# Patient Record
Sex: Female | Born: 1950
Health system: Southern US, Community
[De-identification: ages and names within clinical notes are randomized; demographics above are authoritative.]

## PROBLEM LIST (undated history)

## (undated) DIAGNOSIS — E079 Disorder of thyroid, unspecified: Secondary | ICD-10-CM

## (undated) DIAGNOSIS — K7689 Other specified diseases of liver: Secondary | ICD-10-CM

## (undated) DIAGNOSIS — E785 Hyperlipidemia, unspecified: Secondary | ICD-10-CM

## (undated) DIAGNOSIS — K5730 Diverticulosis of large intestine without perforation or abscess without bleeding: Secondary | ICD-10-CM

## (undated) DIAGNOSIS — M1990 Unspecified osteoarthritis, unspecified site: Secondary | ICD-10-CM

## (undated) HISTORY — DX: Hyperlipidemia, unspecified: E78.5

## (undated) HISTORY — DX: Diverticulosis of large intestine without perforation or abscess without bleeding: K57.30

## (undated) HISTORY — DX: Disorder of thyroid, unspecified: E07.9

## (undated) HISTORY — PX: TONSILLECTOMY: SUR1361

## (undated) HISTORY — DX: Other specified diseases of liver: K76.89

## (undated) HISTORY — PX: OTHER SURGICAL HISTORY: SHX169

---

## 2010-01-27 ENCOUNTER — Encounter: Payer: Self-pay | Admitting: Family Medicine

## 2010-02-22 LAB — CONVERTED CEMR LAB: Pap Smear: NORMAL

## 2010-02-23 ENCOUNTER — Encounter: Payer: Self-pay | Admitting: Family Medicine

## 2010-03-21 LAB — HM COLONOSCOPY: HM Colonoscopy: NORMAL

## 2010-03-22 LAB — HM PAP SMEAR

## 2010-04-19 ENCOUNTER — Encounter: Payer: Self-pay | Admitting: Family Medicine

## 2010-05-02 LAB — HM MAMMOGRAPHY: HM Mammogram: NORMAL

## 2010-06-22 ENCOUNTER — Ambulatory Visit: Payer: Self-pay | Admitting: Family Medicine

## 2010-06-22 DIAGNOSIS — I10 Essential (primary) hypertension: Secondary | ICD-10-CM | POA: Insufficient documentation

## 2010-06-22 DIAGNOSIS — E0590 Thyrotoxicosis, unspecified without thyrotoxic crisis or storm: Secondary | ICD-10-CM | POA: Insufficient documentation

## 2010-06-22 DIAGNOSIS — K5730 Diverticulosis of large intestine without perforation or abscess without bleeding: Secondary | ICD-10-CM | POA: Insufficient documentation

## 2010-06-22 DIAGNOSIS — E785 Hyperlipidemia, unspecified: Secondary | ICD-10-CM | POA: Insufficient documentation

## 2010-07-27 ENCOUNTER — Encounter: Payer: Self-pay | Admitting: Family Medicine

## 2010-07-27 ENCOUNTER — Ambulatory Visit: Payer: Self-pay | Admitting: Family Medicine

## 2010-07-31 ENCOUNTER — Encounter: Payer: Self-pay | Admitting: Family Medicine

## 2010-11-14 ENCOUNTER — Telehealth: Payer: Self-pay | Admitting: Family Medicine

## 2010-11-14 NOTE — Assessment & Plan Note (Signed)
Summary: NEW PT TO BE ESTABLISHED/JRR   Vital Signs:  Patient profile:   60 year old female Height:      60.25 inches Weight:      177 pounds BMI:     34.41 Temp:     98.3 degrees F oral Pulse rate:   64 / minute Pulse rhythm:   regular BP sitting:   124 / 80  (right arm) Cuff size:   regular  Vitals Entered By: Linde Gillis CMA Duncan Dull) (June 22, 2010 9:58 AM) CC: new patient, establish care   CC:  new patient and establish care.  History of Present Illness: 60 yo here to establish care.  HTN- has been on HCTZ 25 mg for years.  Questions if she can come off it because her BP has been great and she started this when she was going through a stressful time in her life.  Does not report dizziness when she stands from a seated position.  Does have a family h/o HTN.  HLD- just had labs drawn in [place], brings them in with her.  Has been on Lovastatin 40 mg for years.  Has h/o liver cyst but no elevated LFTs.  Hyperthyroidism- brings labs in with her today.  TSH was 0.01 in 02/2010.  Was not referred to endocrine because they were about to move to [facility].  Has noticed some loose stools, fatigue.  No palpiations, heat intolerance.  Well woman- UTD on prevention except for mammogram.  Preventive Screening-Counseling & Management  Alcohol-Tobacco     Smoking Status: never      Drug Use:  no.    Current Medications (verified): 1)  Lovastatin 40 Mg Tabs (Lovastatin) .... Take One Tablet By Mouth At Bedtime 2)  Hydrochlorothiazide 25 Mg Tabs (Hydrochlorothiazide) .... Take One Tablet By Mouth Every Morning 3)  Magnesium 500 Mg Tabs (Magnesium) .... Take One Tablet By Mouth At Bedtime 4)  Calcium-Vitamin D 500-200 Mg-Unit Tabs (Calcium Carbonate-Vitamin D) .... Take One Tablet By Mouth At Bedtime  Allergies (verified): 1)  ! Demerol 2)  ! Bactrim  Past History:  Family History: Last updated: 06/22/2010 Mom - HTN, HLD Dad- dementia  Social History: Last updated:  06/22/2010 Recently moved here from [place]. Son is an Administrator, arts hospitalist. Married Never Smoked Alcohol use-yes Drug use-no  Risk Factors: Smoking Status: never (06/22/2010)  Past Medical History: Diverticulosis, colon Hyperlipidemia Hypertension Hyperthyroidism h/o liver cyst  Past Surgical History: Tonsillectomy bone spurs removed from both big toes 2010  Family History: Mom - HTN, HLD Dad- dementia  Social History: Recently moved here from [place]. Son is an Art therapist. Married Never Smoked Alcohol use-yes Drug use-no Smoking Status:  never Drug Use:  no  Review of Systems      See HPI General:  Denies malaise. Eyes:  Denies blurring. ENT:  Denies difficulty swallowing. CV:  Denies chest pain or discomfort, near fainting, and palpitations. Resp:  Denies shortness of breath. GI:  Complains of diarrhea; denies abdominal pain, bloody stools, nausea, and vomiting. GU:  Denies abnormal vaginal bleeding. MS:  Denies joint pain, joint redness, and joint swelling. Derm:  Denies rash. Neuro:  Denies visual disturbances and weakness. Psych:  Denies anxiety and depression. Endo:  Denies cold intolerance and heat intolerance. Heme:  Denies abnormal bruising and bleeding.  Physical Exam  General:  alert, well-developed, and well-nourished.   Head:  normocephalic and atraumatic.   Eyes:  vision grossly intact, pupils equal, pupils round, and pupils reactive to light.  Ears:  R ear normal and L ear normal.   Nose:  no external deformity.   Mouth:  good dentition.   Neck:  No deformities, masses, or tenderness noted. Lungs:  Normal respiratory effort, chest expands symmetrically. Lungs are clear to auscultation, no crackles or wheezes. Heart:  Normal rate and regular rhythm. S1 and S2 normal without gallop, murmur, click, rub or other extra sounds. Abdomen:  Bowel sounds positive,abdomen soft and non-tender without masses, organomegaly or hernias noted. Msk:   No deformity or scoliosis noted of thoracic or lumbar spine.   Extremities:  No clubbing, cyanosis, edema, or deformity noted with normal full range of motion of all joints.   Neurologic:  No cranial nerve deficits noted. Station and gait are normal. Plantar reflexes are down-going bilaterally. DTRs are symmetrical throughout. Sensory, motor and coordinative functions appear intact. Skin:  Intact without suspicious lesions or rashes Psych:  Cognition and judgment appear intact. Alert and cooperative with normal attention span and concentration. No apparent delusions, illusions, hallucinations   Impression & Recommendations:  Problem # 1:  HYPERTENSION (ICD-401.9) Assessment Unchanged Agree wtih pt that she may not need HCTZ.  Will wean down to 12.5 mg daily and follow up in one month. Her updated medication list for this problem includes:    Hydrochlorothiazide 25 Mg Tabs (Hydrochlorothiazide) .Marland Kitchen... Take one tablet by mouth every morning  Problem # 2:  HYPERTHYROIDISM (ICD-242.90) Assessment: Deteriorated Refer to endocrinology for further evaluation and treatment. Orders: Endocrinology Referral (Endocrine)  Problem # 3:  HYPERLIPIDEMIA (ICD-272.4) Assessment: Unchanged Continue Lovastatin at current dose. Her updated medication list for this problem includes:    Lovastatin 40 Mg Tabs (Lovastatin) .Marland Kitchen... Take one tablet by mouth at bedtime  Problem # 4:  DIVERTICULOSIS, COLON (ICD-562.10) Assessment: Unchanged s/p normal colonoscopy.  Complete Medication List: 1)  Lovastatin 40 Mg Tabs (Lovastatin) .... Take one tablet by mouth at bedtime 2)  Hydrochlorothiazide 25 Mg Tabs (Hydrochlorothiazide) .... Take one tablet by mouth every morning 3)  Magnesium 500 Mg Tabs (Magnesium) .... Take one tablet by mouth at bedtime 4)  Calcium-vitamin D 500-200 Mg-unit Tabs (Calcium carbonate-vitamin d) .... Take one tablet by mouth at bedtime  Other Orders: Radiology Referral  (Radiology)  Patient Instructions: 1)  Great to meet you. 2)  Please stop by to see Shirlee Limerick on your way out. 3)  Try cutting your blood pressure medication in half and come see me in one month.    Current Allergies (reviewed today): ! DEMEROL ! BACTRIM  Prevention & Chronic Care Immunizations   Influenza vaccine: Not documented   Influenza vaccine due: Refused  (06/22/2010)    Tetanus booster: Not documented    Pneumococcal vaccine: Not documented  Colorectal Screening   Hemoccult: Not documented   Hemoccult due: Not Indicated    Colonoscopy: historical normal  (03/21/2010)   Colonoscopy due: 03/21/2020  Other Screening   Pap smear: normal  (03/22/2010)   Pap smear due: 02/23/2012    Mammogram: normal  (05/02/2010)   Mammogram action/deferral: Ordered  (06/22/2010)   Mammogram due: 05/02/2010   Smoking status: never  (06/22/2010)  Lipids   Total Cholesterol: Not documented   LDL: 92  (01/27/2010)   LDL Direct: Not documented   HDL: 47  (01/27/2010)   Triglycerides: Not documented    SGOT (AST): Not documented   SGPT (ALT): Not documented   Alkaline phosphatase: Not documented   Total bilirubin: Not documented  Hypertension   Last Blood Pressure: 124 / 80  (  06/22/2010)   Serum creatinine: Not documented   Serum potassium Not documented  Self-Management Support :    Hypertension self-management support: Not documented    Lipid self-management support: Not documented    Nursing Instructions: Schedule screening mammogram (see order)    Flu Vaccine Next Due:  Refused HDL Result Date:  01/27/2010 HDL Result:  47 HDL Next Due:  1 yr LDL Result Date:  01/27/2010 LDL Result:  92 LDL Next Due:  1 yr Flex Sig Next Due:  Not Indicated Colonoscopy Result Date:  03/21/2010 Colonoscopy Result:  historical normal Colonoscopy Next Due:  10 yr Hemoccult Next Due:  Not Indicated PAP Result Date:  02/22/2010 PAP Result:  normal PAP Next Due:  2  yr Mammogram Result Date:  05/02/2009 Mammogram Result:  normal Mammogram Next Due:  1 yr    Past Medical History:    Diverticulosis, colon    Hyperlipidemia    Hypertension    Hyperthyroidism    h/o liver cyst  Past Surgical History:    Tonsillectomy    bone spurs removed from both big toes 2010

## 2010-11-14 NOTE — Letter (Signed)
Summary: Terrilee Files MD  Terrilee Files MD   Imported By: Sherian Rein 06/30/2010 07:21:58  _____________________________________________________________________  External Attachment:    Type:   Image     Comment:   External Document

## 2010-11-14 NOTE — Consult Note (Signed)
Summary: [facility] Endocr[facility]  [facility]   Imported By: Lanelle Bal 08/14/2010 11:36:42  _____________________________________________________________________  External Attachment:    Type:   Image     Comment:   External Document

## 2010-11-22 ENCOUNTER — Ambulatory Visit (INDEPENDENT_AMBULATORY_CARE_PROVIDER_SITE_OTHER): Payer: BC Managed Care – PPO | Admitting: Family Medicine

## 2010-11-22 ENCOUNTER — Encounter: Payer: Self-pay | Admitting: Family Medicine

## 2010-11-22 DIAGNOSIS — I10 Essential (primary) hypertension: Secondary | ICD-10-CM

## 2010-11-22 NOTE — Progress Notes (Signed)
Summary: lovastatin, hctz  Phone Note Refill Request Message from:  Fax from Pharmacy  Refills Requested: Medication #1:  LOVASTATIN 40 MG TABS take one tablet by mouth at bedtime   Last Refilled: 08/16/2010  Medication #2:  HYDROCHLOROTHIAZIDE 25 MG TABS take one tablet by mouth every morning   Last Refilled: 08/16/2010 [facility] [address]  Pt  had new patient appt in september, was told to follow up in october, no appts scheduled.  Initial call taken by: Lowella Petties CMA, AAMA,  November 14, 2010 9:26 AM  Follow-up for Phone Call        ok to fill with no refills, needs appt. Ruthe Mannan MD  November 14, 2010 9:28 AM     Prescriptions: HYDROCHLOROTHIAZIDE 25 MG TABS (HYDROCHLOROTHIAZIDE) take one tablet by mouth every morning  #30 x 0   Entered by:   Lowella Petties CMA, AAMA   Authorized by:   Ruthe Mannan MD   Signed by:   Lowella Petties CMA, AAMA on 11/14/2010   Method used:   [facility]  [address] (retail[facility]       [address]       [place], [place]  [address]       Ph: [phone] or [phone]       Fax: [phone]   RxID:   [id number] LOVASTATIN 40 MG TABS (LOVASTATIN) take one tablet by mouth at bedtime  #30 x 0   Entered by:   Lowella Petties CMA, AAMA   Authorized by:   Ruthe Mannan MD   Signed by:   Lowella Petties CMA, AAMA on 11/14/2010   Method used:   [facility]        [facility]  [address] (retail)       [address]       [place], [place]  [address]       Ph: [phone] or [phone]       Fax: [phone]   RxID:   [id number]

## 2010-11-30 NOTE — Assessment & Plan Note (Signed)
Summary: CHECK BP/CLE   [employer]   Vital Signs:  Patient profile:   60 year old female Height:      60.25 inches Weight:      179.50 pounds BMI:     34.89 Temp:     98.2 degrees F oral Pulse rate:   82 / minute Pulse rhythm:   regular BP sitting:   120 / 80  (left arm) Cuff size:   large  Vitals Entered By: Linde Gillis CMA Duncan Dull) (November 22, 2010 7:54 AM) CC: blood pressure check   History of Present Illness: 61 yo here to follow up HTN.   HTN- had been on HCTZ 25 mg for years. At last office visit in September, we decreased her HCTZ to 12. 5 mg daily since her BP was so good.  She has been tolerating that very well.  In fact, her BP is 120/80 today.  She would prefer to continue on this dose for now since she has been travelling back and forth to [place] to see her sick father.     Current Medications (verified): 1)  Lovastatin 40 Mg Tabs (Lovastatin) .... Take One Tablet By Mouth At Bedtime 2)  Hydrochlorothiazide 25 Mg Tabs (Hydrochlorothiazide) .... Take One Tablet By Mouth Every Morning 3)  Magnesium 500 Mg Tabs (Magnesium) .... Take One Tablet By Mouth At Bedtime 4)  Calcium-Vitamin D 500-200 Mg-Unit Tabs (Calcium Carbonate-Vitamin D) .... Take One Tablet By Mouth At Bedtime  Allergies: 1)  ! Demerol 2)  ! Bactrim  Past History:  Past Medical History: Last updated: 06/22/2010 Diverticulosis, colon Hyperlipidemia Hypertension Hyperthyroidism h/o liver cyst  Past Surgical History: Last updated: 06/22/2010 Tonsillectomy bone spurs removed from both big toes 2010  Family History: Last updated: 06/22/2010 Mom - HTN, HLD Dad- dementia  Social History: Last updated: 06/22/2010 Recently moved here from [place]. Son is an Administrator, arts hospitalist. Married Never Smoked Alcohol use-yes Drug use-no  Risk Factors: Smoking Status: never (06/22/2010)  Review of Systems      See HPI General:  Denies malaise. CV:  Denies chest pain or discomfort. Resp:  Denies  shortness of breath.  Physical Exam  General:  alert, well-developed, and well-nourished.   normotensive Mouth:  good dentition.   Lungs:  Normal respiratory effort, chest expands symmetrically. Lungs are clear to auscultation, no crackles or wheezes. Heart:  Normal rate and regular rhythm. S1 and S2 normal without gallop, murmur, click, rub or other extra sounds. Extremities:  No clubbing, cyanosis, edema, or deformity noted with normal full range of motion of all joints.   Neurologic:  No cranial nerve deficits noted. Station and gait are normal. Plantar reflexes are down-going bilaterally. DTRs are symmetrical throughout. Sensory, motor and coordinative functions appear intact. Psych:  Cognition and judgment appear intact. Alert and cooperative with normal attention span and concentration. No apparent delusions, illusions, hallucinations   Impression & Recommendations:  Problem # 1:  HYPERTENSION (ICD-401.9) Assessment Unchanged Reamins stable on 12. 5 mg daily.  Continue current dose.  Follow up in September (yearly CPX). Her updated medication list for this problem includes:    Hydrochlorothiazide 12.5 Mg Tabs (Hydrochlorothiazide) .Marland Kitchen... Take 1 tab  by mouth every morning  Complete Medication List: 1)  Lovastatin 40 Mg Tabs (Lovastatin) .... Take one tablet by mouth at bedtime 2)  Hydrochlorothiazide 12.5 Mg Tabs (Hydrochlorothiazide) .... Take 1 tab  by mouth every morning 3)  Magnesium 500 Mg Tabs (Magnesium) .... Take one tablet by mouth at bedtime 4)  Calcium-vitamin D 500-200 Mg-unit Tabs (Calcium carbonate-vitamin d) .... Take one tablet by mouth at bedtime Prescriptions: HYDROCHLOROTHIAZIDE 12.5 MG  TABS (HYDROCHLOROTHIAZIDE) Take 1 tab  by mouth every morning  #90 x 3   Entered and Authorized by:   Ruthe Mannan MD   Signed by:   Ruthe Mannan MD on 11/22/2010   Method used:   Print then Give to Patient   RxID:   [id number] LOVASTATIN 40 MG TABS (LOVASTATIN) take one  tablet by mouth at bedtime  #30 x 6   Entered and Authorized by:   Ruthe Mannan MD   Signed by:   Ruthe Mannan MD on 11/22/2010   Method used:   Print then Give to Patient   RxID:   [id number]    Orders Added: 1)  Est. Patient Level III [[address]]    Current Allergies (reviewed today): ! DEMEROL ! BACTRIM

## 2011-06-15 ENCOUNTER — Other Ambulatory Visit: Payer: Self-pay | Admitting: Family Medicine

## 2011-06-15 DIAGNOSIS — E785 Hyperlipidemia, unspecified: Secondary | ICD-10-CM

## 2011-06-15 DIAGNOSIS — E0590 Thyrotoxicosis, unspecified without thyrotoxic crisis or storm: Secondary | ICD-10-CM

## 2011-06-15 DIAGNOSIS — I10 Essential (primary) hypertension: Secondary | ICD-10-CM

## 2011-06-21 ENCOUNTER — Other Ambulatory Visit (INDEPENDENT_AMBULATORY_CARE_PROVIDER_SITE_OTHER): Payer: BC Managed Care – PPO

## 2011-06-21 DIAGNOSIS — E785 Hyperlipidemia, unspecified: Secondary | ICD-10-CM

## 2011-06-21 DIAGNOSIS — E0590 Thyrotoxicosis, unspecified without thyrotoxic crisis or storm: Secondary | ICD-10-CM

## 2011-06-21 DIAGNOSIS — I10 Essential (primary) hypertension: Secondary | ICD-10-CM

## 2011-06-21 LAB — CBC WITH DIFFERENTIAL/PLATELET
Basophils Absolute: 0 10*3/uL (ref 0.0–0.1)
Basophils Relative: 0.6 % (ref 0.0–3.0)
Eosinophils Relative: 1.9 % (ref 0.0–5.0)
HCT: 44.3 % (ref 36.0–46.0)
Hemoglobin: 15.1 g/dL — ABNORMAL HIGH (ref 12.0–15.0)
Lymphocytes Relative: 28.8 % (ref 12.0–46.0)
Lymphs Abs: 1.6 10*3/uL (ref 0.7–4.0)
Monocytes Relative: 6.5 % (ref 3.0–12.0)
Neutro Abs: 3.4 10*3/uL (ref 1.4–7.7)
RBC: 4.82 Mil/uL (ref 3.87–5.11)
RDW: 12.4 % (ref 11.5–14.6)

## 2011-06-21 LAB — LIPID PANEL
Cholesterol: 195 mg/dL (ref 0–200)
HDL: 44.4 mg/dL (ref >39.00)
Total CHOL/HDL Ratio: 4
Triglycerides: 235 mg/dL — ABNORMAL HIGH (ref 0.0–149.0)

## 2011-06-21 LAB — COMPREHENSIVE METABOLIC PANEL
Alkaline Phosphatase: 86 U/L (ref 39–117)
Creatinine, Ser: 0.8 mg/dL (ref 0.4–1.2)
GFR: 83.95 mL/min (ref >60.00)
Glucose, Bld: 95 mg/dL (ref 70–99)
Sodium: 140 mEq/L (ref 135–145)
Total Bilirubin: 1 mg/dL (ref 0.3–1.2)
Total Protein: 6.6 g/dL (ref 6.0–8.3)

## 2011-06-21 LAB — TSH: TSH: 0.03 u[IU]/mL — ABNORMAL LOW (ref 0.35–5.50)

## 2011-06-26 ENCOUNTER — Other Ambulatory Visit (HOSPITAL_COMMUNITY)
Admission: RE | Admit: 2011-06-26 | Discharge: 2011-06-26 | Disposition: A | Discharge status: Home / Self Care | Payer: BC Managed Care – PPO | Source: Ambulatory Visit | Attending: Family Medicine | Admitting: Family Medicine

## 2011-06-26 ENCOUNTER — Ambulatory Visit (INDEPENDENT_AMBULATORY_CARE_PROVIDER_SITE_OTHER): Payer: BC Managed Care – PPO | Admitting: Family Medicine

## 2011-06-26 ENCOUNTER — Encounter: Payer: Self-pay | Admitting: Family Medicine

## 2011-06-26 VITALS — BP 120/82 | HR 83 | Temp 98.1°F | Ht 60.0 in | Wt 175.5 lb

## 2011-06-26 DIAGNOSIS — E785 Hyperlipidemia, unspecified: Secondary | ICD-10-CM

## 2011-06-26 DIAGNOSIS — Z0000 Encounter for general adult medical examination without abnormal findings: Secondary | ICD-10-CM | POA: Insufficient documentation

## 2011-06-26 DIAGNOSIS — E0590 Thyrotoxicosis, unspecified without thyrotoxic crisis or storm: Secondary | ICD-10-CM

## 2011-06-26 DIAGNOSIS — Z01419 Encounter for gynecological examination (general) (routine) without abnormal findings: Secondary | ICD-10-CM | POA: Insufficient documentation

## 2011-06-26 DIAGNOSIS — I10 Essential (primary) hypertension: Secondary | ICD-10-CM

## 2011-06-26 DIAGNOSIS — Z1159 Encounter for screening for other viral diseases: Secondary | ICD-10-CM | POA: Insufficient documentation

## 2011-06-26 DIAGNOSIS — Z1231 Encounter for screening mammogram for malignant neoplasm of breast: Secondary | ICD-10-CM

## 2011-06-26 MED ORDER — FLUTICASONE PROPIONATE 50 MCG/ACT NA SUSP: 1.0000 | Freq: Every day | NASAL | Status: DC

## 2011-06-26 NOTE — Progress Notes (Signed)
Subjective:    Patient ID: Julia Moreno, female    DOB: 04/05/1951, 60 y.o.   MRN: [id number]  HPI  60 yo here for CPX.  HTN- was previously taking HCTZ. Well controlled now without medication, trying to walk more. No CP, SOB, blurred vision.  HLD-  Lab Results  Component Value Date   CHOL 195 06/21/2011   Lab Results  Component Value Date   HDL 44.40 06/21/2011   HDL 47 01/27/2010   Lab Results  Component Value Date   LDLCALC 92 01/27/2010   Lab Results  Component Value Date   TRIG 235.0* 06/21/2011   Lab Results  Component Value Date   ALT 37* 06/21/2011   AST 22 06/21/2011   ALKPHOS 86 06/21/2011   BILITOT 1.0 06/21/2011    Has been on Lovastatin 40 mg for years. Has h/o liver cyst but no elevated LFTs. Admits to eating more sweets lately.  Hyperthyroidism- TSH low but FT4 within normal limits.  Has endocrinologist. Biopsy neg, told it was thyroiditis. Lab Results  Component Value Date   TSH 0.03* 06/21/2011   Patient Active Problem List  Diagnoses  . HYPERTHYROIDISM  . HYPERLIPIDEMIA  . HYPERTENSION  . DIVERTICULOSIS, COLON  . Routine general medical examination at a health care facility    History  Substance Use Topics  . Smoking status: Not on file  . Smokeless tobacco: Not on file  . Alcohol Use: Not on file   No family history on file. Allergies  Allergen Reactions  . Meperidine Hcl     REACTION: vomiting  . Sulfamethoxazole W/Trimethoprim     REACTION: hives   No current outpatient prescriptions on file prior to visit.   The PMH, PSH, Social History, Family History, Medications, and allergies have been reviewed in [facility], and have been updated if relevant.   Review of Systems See HPI Patient reports no  vision/ hearing changes,anorexia, weight change, fever ,adenopathy, persistant / recurrent hoarseness, swallowing issues, chest pain, edema,persistant / recurrent cough, hemoptysis, dyspnea(rest, exertional, paroxysmal nocturnal), gastrointestinal   bleeding (melena, rectal bleeding), abdominal pain, excessive heart burn, GU symptoms(dysuria, hematuria, pyuria, voiding/incontinence  Issues) syncope, focal weakness, severe memory loss, concerning skin lesions, depression, anxiety, abnormal bruising/bleeding, major joint swelling, breast masses or abnormal vaginal bleeding.       Objective:   Physical Exam BP 120/82  Pulse 83  Temp(Src) 98.1 F (36.7 C) (Oral)  Ht 5' (1.524 m)  Wt 175 lb 8 oz (79.606 kg)  BMI 34.27 kg/m2  General:  Well-developed,well-nourished,in no acute distress; alert,appropriate and cooperative throughout examination Head:  normocephalic and atraumatic.   Eyes:  vision grossly intact, pupils equal, pupils round, and pupils reactive to light.   Ears:  R ear normal and L ear normal.   Nose:  no external deformity.   Mouth:  good dentition.   Neck:  No deformities, masses, or tenderness noted. Breasts:  No mass, nodules, thickening, tenderness, bulging, retraction, inflamation, nipple discharge or skin changes noted.   Lungs:  Normal respiratory effort, chest expands symmetrically. Lungs are clear to auscultation, no crackles or wheezes. Heart:  Normal rate and regular rhythm. S1 and S2 normal without gallop, murmur, click, rub or other extra sounds. Abdomen:  Bowel sounds positive,abdomen soft and non-tender without masses, organomegaly or hernias noted. Rectal:  no external abnormalities.   Genitalia:  Pelvic Exam:        External: normal female genitalia without lesions or masses        Vagina:  normal without lesions or masses        Cervix: normal without lesions or masses        Adnexa: normal bimanual exam without masses or fullness        Uterus: normal by palpation        Pap smear: performed Msk:  No deformity or scoliosis noted of thoracic or lumbar spine.   Extremities:  No clubbing, cyanosis, edema, or deformity noted with normal full range of motion of all joints.   Neurologic:  alert & oriented X3  and gait normal.   Skin:  Intact without suspicious lesions or rashes Cervical Nodes:  No lymphadenopathy noted Axillary Nodes:  No palpable lymphadenopathy Psych:  Cognition and judgment appear intact. Alert and cooperative with normal attention span and concentration. No apparent delusions, illusions, hallucinations    Assessment & Plan:   1. HYPERTENSION  Stable without meds.   2. HYPERTHYROIDISM  FT4 within normal limits, results forwarded to her endocrinologist.    3. HYPERLIPIDEMIA  Deteriorated. Discussed decreasing added sugars, eliminate trans fats, increase fiber and limit alcohol.   4. Routine general medical examination at a health care facility  Reviewed preventive care protocols, scheduled due services, and updated immunizations Discussed nutrition, exercise, diet, and healthy lifestyle.  Cytology -Pap Smear Mammogram IFOB

## 2011-06-26 NOTE — Patient Instructions (Signed)
Great to see you. Please stop by to see Julia Moreno on your way out.   

## 2011-06-28 ENCOUNTER — Encounter: Payer: Self-pay | Admitting: *Deleted

## 2011-07-05 ENCOUNTER — Other Ambulatory Visit: Payer: Self-pay | Admitting: Family Medicine

## 2011-07-05 ENCOUNTER — Encounter: Payer: Self-pay | Admitting: *Deleted

## 2011-07-05 ENCOUNTER — Other Ambulatory Visit: Payer: BC Managed Care – PPO

## 2011-07-05 DIAGNOSIS — Z1211 Encounter for screening for malignant neoplasm of colon: Secondary | ICD-10-CM

## 2011-07-05 LAB — FECAL OCCULT BLOOD, IMMUNOCHEMICAL: Fecal Occult Bld: NEGATIVE

## 2011-08-22 ENCOUNTER — Ambulatory Visit: Payer: Self-pay | Admitting: Family Medicine

## 2011-08-23 ENCOUNTER — Encounter: Payer: Self-pay | Admitting: *Deleted

## 2011-08-23 ENCOUNTER — Encounter: Payer: Self-pay | Admitting: Family Medicine

## 2012-01-10 ENCOUNTER — Other Ambulatory Visit: Payer: Self-pay | Admitting: Family Medicine

## 2012-04-23 ENCOUNTER — Other Ambulatory Visit: Payer: Self-pay | Admitting: Family Medicine

## 2012-05-13 ENCOUNTER — Ambulatory Visit (INDEPENDENT_AMBULATORY_CARE_PROVIDER_SITE_OTHER): Payer: BC Managed Care – PPO | Admitting: Family Medicine

## 2012-05-13 ENCOUNTER — Encounter: Payer: Self-pay | Admitting: Family Medicine

## 2012-05-13 VITALS — BP 100/64 | HR 68 | Temp 98.2°F | Wt 178.0 lb

## 2012-05-13 DIAGNOSIS — M7700 Medial epicondylitis, unspecified elbow: Secondary | ICD-10-CM

## 2012-05-13 NOTE — Patient Instructions (Addendum)
Great to see you. Please follow exercises as directed. You have rotator cuff impingement Aleve and/or tylenol as needed for pain. Cortisone injection may be helpful- you can make an appointment with Dr. Patsy Lager in the future as we discussed.

## 2012-05-13 NOTE — Progress Notes (Signed)
SUBJECTIVE: Julia Moreno is a 61 y.o. female right elbow pain x 2 months.  No known injury but she has been playing golf almost daily for past two months.  More swollen in afternoon.  No radiculopathy or UE weakness.   Prior history of related problems: no prior problems with this area in the past.  Patient Active Problem List  Diagnosis  . HYPERTHYROIDISM  . HYPERLIPIDEMIA  . HYPERTENSION  . DIVERTICULOSIS, COLON  . Routine general medical examination at a health care facility  . Epicondylitis elbow, medial   Past Medical History  Diagnosis Date  . Diverticulosis of colon   . Hypertension   . Hyperlipidemia   . Thyroid disease   . Liver cyst    Past Surgical History  Procedure Date  . Tonsillectomy   . Bone spur     REMOVED FROM BOTHE BIG TOES 2010   History  Substance Use Topics  . Smoking status: Never Smoker   . Smokeless tobacco: Not on file  . Alcohol Use: Yes   Family History  Problem Relation Age of Onset  . Hyperlipidemia Mother   . Hypertension Mother   . Dementia Father    Allergies  Allergen Reactions  . Meperidine Hcl     REACTION: vomiting  . Sulfamethoxazole W-Trimethoprim     REACTION: hives   Current Outpatient Prescriptions on File Prior to Visit  Medication Sig Dispense Refill  . Calcium Carb-Cholecalciferol (CALCIUM 500 +D) 500-400 MG-UNIT TABS Take 1 capsule by mouth at bedtime.        . fluticasone (FLONASE) 50 MCG/ACT nasal spray PLACE 1 SPRAY INTO THE NOSE DAILY.  16 g  3  . lovastatin (MEVACOR) 40 MG tablet TAKE ONE TABLET BY MOUTH AT BEDTIME  30 tablet  6   The PMH, PSH, Social History, Family History, Medications, and allergies have been reviewed in [facility], and have been updated if relevant.  OBJECTIVE: BP 100/64  Pulse 68  Temp 98.2 F (36.8 C) (Oral)  Wt 178 lb (80.74 kg)  Appearance: alert, well appearing, and in no distress. Elbow exam: medial epicondylar tenderness. X-ray: not indicated.  ASSESSMENT: elbow  epicondylitis  PLAN: Given handout from sports med advisor with supportive care. If no improvement, discussed cortisone injections (she could make appt with Dr. Patsy Moreno). The patient indicates understanding of these issues and agrees with the plan.

## 2012-05-13 NOTE — Progress Notes (Signed)
  Subjective:    Patient ID: Julia Moreno, female    DOB: [birth year], 61 y.o.   MRN: [id number]  HPI  61 yo here for right elbow pain x 2 months.  Review of Systems     Objective:   Physical Exam        Assessment & Plan:

## 2012-06-23 ENCOUNTER — Other Ambulatory Visit: Payer: Self-pay | Admitting: Family Medicine

## 2012-06-23 DIAGNOSIS — E0590 Thyrotoxicosis, unspecified without thyrotoxic crisis or storm: Secondary | ICD-10-CM

## 2012-06-23 DIAGNOSIS — I10 Essential (primary) hypertension: Secondary | ICD-10-CM

## 2012-06-23 DIAGNOSIS — E785 Hyperlipidemia, unspecified: Secondary | ICD-10-CM

## 2012-06-23 DIAGNOSIS — Z0000 Encounter for general adult medical examination without abnormal findings: Secondary | ICD-10-CM

## 2012-06-26 ENCOUNTER — Other Ambulatory Visit (INDEPENDENT_AMBULATORY_CARE_PROVIDER_SITE_OTHER): Payer: BC Managed Care – PPO

## 2012-06-26 DIAGNOSIS — E785 Hyperlipidemia, unspecified: Secondary | ICD-10-CM

## 2012-06-26 DIAGNOSIS — E0590 Thyrotoxicosis, unspecified without thyrotoxic crisis or storm: Secondary | ICD-10-CM

## 2012-06-26 DIAGNOSIS — Z0000 Encounter for general adult medical examination without abnormal findings: Secondary | ICD-10-CM

## 2012-06-26 LAB — CBC WITH DIFFERENTIAL/PLATELET
Basophils Absolute: 0 10*3/uL (ref 0.0–0.1)
Basophils Relative: 0.5 % (ref 0.0–3.0)
Eosinophils Absolute: 0.1 10*3/uL (ref 0.0–0.7)
Hemoglobin: 14.6 g/dL (ref 12.0–15.0)
Lymphocytes Relative: 13.1 % (ref 12.0–46.0)
Monocytes Relative: 7 % (ref 3.0–12.0)
Neutro Abs: 4.9 10*3/uL (ref 1.4–7.7)
Neutrophils Relative %: 77.8 % — ABNORMAL HIGH (ref 43.0–77.0)
RBC: 4.68 Mil/uL (ref 3.87–5.11)
RDW: 12.3 % (ref 11.5–14.6)

## 2012-06-26 LAB — LIPID PANEL
HDL: 41 mg/dL (ref >39.00)
Triglycerides: 167 mg/dL — ABNORMAL HIGH (ref 0.0–149.0)
VLDL: 33.4 mg/dL (ref 0.0–40.0)

## 2012-06-26 LAB — COMPREHENSIVE METABOLIC PANEL
Albumin: 4.3 g/dL (ref 3.5–5.2)
BUN: 21 mg/dL (ref 6–23)
CO2: 22 mEq/L (ref 19–32)
Calcium: 9.5 mg/dL (ref 8.4–10.5)
Chloride: 104 mEq/L (ref 96–112)
Creatinine, Ser: 1 mg/dL (ref 0.4–1.2)
GFR: 60.03 mL/min (ref >60.00)
Glucose, Bld: 99 mg/dL (ref 70–99)
Potassium: 4.4 mEq/L (ref 3.5–5.1)

## 2012-06-30 ENCOUNTER — Encounter: Payer: Self-pay | Admitting: Family Medicine

## 2012-06-30 ENCOUNTER — Ambulatory Visit (INDEPENDENT_AMBULATORY_CARE_PROVIDER_SITE_OTHER): Payer: BC Managed Care – PPO | Admitting: Family Medicine

## 2012-06-30 VITALS — BP 120/78 | HR 68 | Temp 98.1°F | Ht 60.25 in | Wt 175.0 lb

## 2012-06-30 DIAGNOSIS — Z1231 Encounter for screening mammogram for malignant neoplasm of breast: Secondary | ICD-10-CM

## 2012-06-30 DIAGNOSIS — I10 Essential (primary) hypertension: Secondary | ICD-10-CM

## 2012-06-30 DIAGNOSIS — E785 Hyperlipidemia, unspecified: Secondary | ICD-10-CM

## 2012-06-30 DIAGNOSIS — Z1211 Encounter for screening for malignant neoplasm of colon: Secondary | ICD-10-CM

## 2012-06-30 DIAGNOSIS — Z0000 Encounter for general adult medical examination without abnormal findings: Secondary | ICD-10-CM

## 2012-06-30 DIAGNOSIS — K5730 Diverticulosis of large intestine without perforation or abscess without bleeding: Secondary | ICD-10-CM

## 2012-06-30 DIAGNOSIS — D369 Benign neoplasm, unspecified site: Secondary | ICD-10-CM

## 2012-06-30 DIAGNOSIS — E0590 Thyrotoxicosis, unspecified without thyrotoxic crisis or storm: Secondary | ICD-10-CM

## 2012-06-30 NOTE — Addendum Note (Signed)
Addended by: Alvina Chou on: 06/30/2012 10:22 AM   Modules accepted: Orders

## 2012-06-30 NOTE — Patient Instructions (Signed)
Great to see you!  Check with your insurance to see if they will cover the shingles shot.  Please stop by to see Shirlee Limerick on your way out to set up your way out.

## 2012-06-30 NOTE — Progress Notes (Signed)
Subjective:    Patient ID: Julia Moreno, female    DOB: 04/28/1951, 61 y.o.   MRN: [id number]  HPI  61 yo female with h/o HTN, HLD here for CPX.  Normal pap smear in 06/2011.  Due for mammogram, zostavax, flu shot and IFOB.  Has been working on her diet, increasing her exercise.  Wt Readings from Last 3 Encounters:  06/30/12 175 lb (79.379 kg)  05/13/12 178 lb (80.74 kg)  06/26/11 175 lb 8 oz (79.606 kg)    HTN- was previously taking HCTZ. Well controlled now without medication, trying to walk more. No CP, SOB, blurred vision.  HLD-   Lab Results  Component Value Date   CHOL 185 06/26/2012   HDL 41.00 06/26/2012   LDLCALC 111* 06/26/2012   LDLDIRECT 122.9 06/21/2011   TRIG 167.0* 06/26/2012   CHOLHDL 5 06/26/2012     Has been on Lovastatin 40 mg for years. Has h/o liver cyst but no elevated LFTs. Admits to eating more sweets lately.  Hyperthyroidism-   Has endocrinologist, Dr. Tedd Sias. Biopsy neg, told it was thyroiditis. Lab Results  Component Value Date   TSH 1.86 06/26/2012   Patient Active Problem List  Diagnosis  . HYPERTHYROIDISM  . HYPERLIPIDEMIA  . HYPERTENSION  . DIVERTICULOSIS, COLON  . Routine general medical examination at a health care facility  . Epicondylitis elbow, medial    History  Substance Use Topics  . Smoking status: Never Smoker   . Smokeless tobacco: Not on file  . Alcohol Use: Yes   Family History  Problem Relation Age of Onset  . Hyperlipidemia Mother   . Hypertension Mother   . Dementia Father    Allergies  Allergen Reactions  . Meperidine Hcl     REACTION: vomiting  . Sulfamethoxazole W-Trimethoprim     REACTION: hives   Current Outpatient Prescriptions on File Prior to Visit  Medication Sig Dispense Refill  . Calcium Carb-Cholecalciferol (CALCIUM 500 +D) 500-400 MG-UNIT TABS Take 1 capsule by mouth at bedtime.        . fluticasone (FLONASE) 50 MCG/ACT nasal spray PLACE 1 SPRAY INTO THE NOSE DAILY.  16 g  3  .  lovastatin (MEVACOR) 40 MG tablet TAKE ONE TABLET BY MOUTH AT BEDTIME  30 tablet  6  . Probiotic Product (PROBIOTIC PO) Take by mouth.       The PMH, PSH, Social History, Family History, Medications, and allergies have been reviewed in [facility], and have been updated if relevant.   Review of Systems See HPI Patient reports no  vision/ hearing changes,anorexia, weight change, fever ,adenopathy, persistant / recurrent hoarseness, swallowing issues, chest pain, edema,persistant / recurrent cough, hemoptysis, dyspnea(rest, exertional, paroxysmal nocturnal), gastrointestinal  bleeding (melena, rectal bleeding), abdominal pain, excessive heart burn, GU symptoms(dysuria, hematuria, pyuria, voiding/incontinence  Issues) syncope, focal weakness, severe memory loss, concerning skin lesions, depression, anxiety, abnormal bruising/bleeding, major joint swelling, breast masses or abnormal vaginal bleeding.       Objective:   Physical Exam BP 120/78  Pulse 68  Temp 98.1 F (36.7 C)  Ht 5' 0.25" (1.53 m)  Wt 175 lb (79.379 kg)  BMI 33.89 kg/m2  General:  Well-developed,well-nourished,in no acute distress; alert,appropriate and cooperative throughout examination Head:  normocephalic and atraumatic.   Eyes:  vision grossly intact, pupils equal, pupils round, and pupils reactive to light.   Ears:  R ear normal and L ear normal.   Nose:  no external deformity.   Mouth:  good dentition.   Neck:  No deformities, masses, or tenderness noted. Breasts:  No mass, nodules, thickening, tenderness, bulging, retraction, inflamation, nipple discharge or skin changes noted.   Lungs:  Normal respiratory effort, chest expands symmetrically. Lungs are clear to auscultation, no crackles or wheezes. Heart:  Normal rate and regular rhythm. S1 and S2 normal without gallop, murmur, click, rub or other extra sounds. Abdomen:  Bowel sounds positive,abdomen soft and non-tender without masses, organomegaly or hernias noted. Msk:   No deformity or scoliosis noted of thoracic or lumbar spine.   Extremities:  No clubbing, cyanosis, edema, or deformity noted with normal full range of motion of all joints.   Neurologic:  alert & oriented X3 and gait normal.   Skin:  Firm, freely movable mass on left back shoulder, non tender to palpation, no erythema or drainage. Cervical Nodes:  No lymphadenopathy noted Axillary Nodes:  No palpable lymphadenopathy Psych:  Cognition and judgment appear intact. Alert and cooperative with normal attention span and concentration. No apparent delusions, illusions, hallucinations    Assessment & Plan:   1. HYPERTENSION  Stable without meds.   2. HYPERTHYROIDISM  Stable, followed by Dr. Tedd Sias.    3. HYPERLIPIDEMIA  Lipid panel excellent. Continue current meds.   4. Routine general medical examination at a health care facility  Reviewed preventive care protocols, scheduled due services, and updated immunizations Discussed nutrition, exercise, diet, and healthy lifestyle.   Mammogram I   5. Cyst, likely epidermoid  Refer to derm.

## 2012-07-07 ENCOUNTER — Encounter: Payer: Self-pay | Admitting: Family Medicine

## 2012-07-07 ENCOUNTER — Ambulatory Visit (INDEPENDENT_AMBULATORY_CARE_PROVIDER_SITE_OTHER): Payer: BC Managed Care – PPO | Admitting: Family Medicine

## 2012-07-07 VITALS — BP 114/70 | HR 72 | Temp 97.8°F | Wt 174.0 lb

## 2012-07-07 DIAGNOSIS — M7711 Lateral epicondylitis, right elbow: Secondary | ICD-10-CM

## 2012-07-07 DIAGNOSIS — M7710 Lateral epicondylitis, unspecified elbow: Secondary | ICD-10-CM

## 2012-07-07 NOTE — Progress Notes (Signed)
Patient presents with lateral elbow pain.  Length of symptoms:  5 months Hand effected: R  Patient describes a dull ache on the lateral elbow. There is some translation in the proximal forearm and in the distal upper arm. It is painful to lift with the hand facing down and to lift with the thumb in an upright position. Supination is painful. Patient points to the lateral epicondyle as the point of maximal tenderness near ECRB.  Golfer No trauma.   No prior fractures or operative interventions in the effective hand. Prior PT or HEP: few days of HEP  Denies numbness or tingling. No significant neck or shoulder pain.  Hand of dominance: R  Patient Active Problem List  Diagnosis  . HYPERTHYROIDISM  . HYPERLIPIDEMIA  . HYPERTENSION  . DIVERTICULOSIS, COLON  . Routine general medical examination at a health care facility  . Epicondylitis elbow, medial    Past Medical History  Diagnosis Date  . Diverticulosis of colon   . Hypertension   . Hyperlipidemia   . Thyroid disease   . Liver cyst     Past Surgical History  Procedure Date  . Tonsillectomy   . Bone spur     REMOVED FROM BOTHE BIG TOES 2010    History  Substance Use Topics  . Smoking status: Never Smoker   . Smokeless tobacco: Not on file  . Alcohol Use: Yes    Family History  Problem Relation Age of Onset  . Hyperlipidemia Mother   . Hypertension Mother   . Dementia Father     Allergies  Allergen Reactions  . Meperidine Hcl     REACTION: vomiting  . Sulfamethoxazole W-Trimethoprim     REACTION: hives    Current Outpatient Prescriptions on File Prior to Visit  Medication Sig Dispense Refill  . Calcium Carb-Cholecalciferol (CALCIUM 500 +D) 500-400 MG-UNIT TABS Take 1 capsule by mouth at bedtime.        . fluticasone (FLONASE) 50 MCG/ACT nasal spray PLACE 1 SPRAY INTO THE NOSE DAILY.  16 g  3  . lovastatin (MEVACOR) 40 MG tablet TAKE ONE TABLET BY MOUTH AT BEDTIME  30 tablet  6  . MILK THISTLE  EXTRACT PO Take by mouth. Take 250 mg's by mouth daily      . Probiotic Product (PROBIOTIC PO) Take by mouth.         REVIEW OF SYSTEMS  GEN: No fevers, chills. Nontoxic. Primarily MSK c/o today. MSK: Detailed in the HPI GI: tolerating PO intake without difficulty Neuro: No numbness, parasthesias, or tingling associated. Otherwise the pertinent positives of the ROS are noted above.   PHYSICAL EXAM  Blood pressure 114/70, pulse 72, temperature 97.8 F (36.6 C), weight 174 lb (78.926 kg).  GEN: Well-developed,well-nourished,in no acute distress; alert,appropriate and cooperative throughout examination HEENT: Normocephalic and atraumatic without obvious abnormalities. Ears, externally no deformities PULM: Breathing comfortably in no respiratory distress EXT: No clubbing, cyanosis, or edema PSYCH: Normally interactive. Cooperative during the interview. Pleasant. Friendly and conversant. Not anxious or depressed appearing. Normal, full affect.  R elbow Ecchymosis or edema: neg ROM: full flexion, extension, pronation, supination Shoulder ROM: Full Flexion: 5/5 Extension: 5/5, PAINFUL Supination: 5/5, PAINFUL Pronation: 5/5 Wrist ext: 5/5 Wrist flexion: 5/5 No gross bony abnormality Varus and Valgus stress: stable ECRB tenderness: YES, TTP Medial epicondyle: NT Lateral epicondyle, resisted wrist extension from wrist full pronation and flexion: PAINFUL grip: 5/5  sensation intact Tinel's, Elbow: negative  A/P: Lateral Epicondylitis: Elbow anatomy was reviewed, and  tendinopathy was explained.  Pt. given a formal rehab program Series of concentric and eccentric exercises should be done starting with no weight, work up to 1 lb, hammer, etc.  Use counterforce strap if working or using hands. (has at home) Emphasized stretching an cross-friction massage Emphasized proper palms up lifting biomechanics to unload ECRB   Lateral Epicondylitis Injection, RIGHT Verbal consent was  obtained from the patient. Risks, benefits, and alternatives were discussed. Potential complications including loss of pigment, atrophy, and rare risk of infection were discussed. Prepped with Chloraprep and Ethyl Chloride used for anesthesia. Under sterile conditions, the patient was injected at the point of maximal tenderness at the ECRB tendon with 1/2 cc of Lidocaine 1% and 1/2 cc of Depo-Medrol 40 mg. Decreased pain after injection. No complications.  Needle size: 22 gauge 1 1/2 inch   F/u 6 weeks

## 2012-07-10 ENCOUNTER — Encounter: Payer: Self-pay | Admitting: Family Medicine

## 2012-08-11 ENCOUNTER — Other Ambulatory Visit: Payer: Self-pay | Admitting: Family Medicine

## 2012-08-22 ENCOUNTER — Ambulatory Visit: Payer: Self-pay | Admitting: Family Medicine

## 2012-08-25 ENCOUNTER — Encounter: Payer: Self-pay | Admitting: Family Medicine

## 2012-08-26 ENCOUNTER — Encounter: Payer: Self-pay | Admitting: Family Medicine

## 2012-08-26 ENCOUNTER — Encounter: Payer: Self-pay | Admitting: *Deleted

## 2013-03-18 ENCOUNTER — Ambulatory Visit (INDEPENDENT_AMBULATORY_CARE_PROVIDER_SITE_OTHER): Payer: BC Managed Care – PPO | Admitting: Family Medicine

## 2013-03-18 ENCOUNTER — Encounter: Payer: Self-pay | Admitting: Family Medicine

## 2013-03-18 ENCOUNTER — Ambulatory Visit (INDEPENDENT_AMBULATORY_CARE_PROVIDER_SITE_OTHER)
Admission: RE | Admit: 2013-03-18 | Discharge: 2013-03-18 | Disposition: A | Discharge status: Home / Self Care | Payer: BC Managed Care – PPO | Source: Ambulatory Visit | Attending: Family Medicine | Admitting: Family Medicine

## 2013-03-18 VITALS — BP 112/74 | HR 67 | Temp 98.2°F | Wt 171.5 lb

## 2013-03-18 DIAGNOSIS — G5700 Lesion of sciatic nerve, unspecified lower limb: Secondary | ICD-10-CM

## 2013-03-18 DIAGNOSIS — G5702 Lesion of sciatic nerve, left lower limb: Secondary | ICD-10-CM

## 2013-03-18 DIAGNOSIS — M2550 Pain in unspecified joint: Secondary | ICD-10-CM | POA: Insufficient documentation

## 2013-03-18 LAB — COMPREHENSIVE METABOLIC PANEL
ALT: 43 U/L — ABNORMAL HIGH (ref 0–35)
BUN: 20 mg/dL (ref 6–23)
CO2: 29 mEq/L (ref 19–32)
Calcium: 9.7 mg/dL (ref 8.4–10.5)
Chloride: 104 mEq/L (ref 96–112)
Creatinine, Ser: 0.8 mg/dL (ref 0.4–1.2)
GFR: 78.6 mL/min (ref >60.00)
Total Bilirubin: 0.9 mg/dL (ref 0.3–1.2)

## 2013-03-18 LAB — CBC WITH DIFFERENTIAL/PLATELET
Basophils Absolute: 0 10*3/uL (ref 0.0–0.1)
Basophils Relative: 0.4 % (ref 0.0–3.0)
Eosinophils Absolute: 0.1 10*3/uL (ref 0.0–0.7)
HCT: 44.1 % (ref 36.0–46.0)
Hemoglobin: 14.9 g/dL (ref 12.0–15.0)
Lymphocytes Relative: 30.4 % (ref 12.0–46.0)
Lymphs Abs: 1.8 10*3/uL (ref 0.7–4.0)
MCHC: 33.8 g/dL (ref 30.0–36.0)
Monocytes Relative: 7.8 % (ref 3.0–12.0)
Neutro Abs: 3.6 10*3/uL (ref 1.4–7.7)
RBC: 4.82 Mil/uL (ref 3.87–5.11)
RDW: 12.4 % (ref 11.5–14.6)

## 2013-03-18 LAB — HIGH SENSITIVITY CRP: CRP, High Sensitivity: 1.45 mg/L (ref 0.000–5.000)

## 2013-03-18 NOTE — Progress Notes (Signed)
  Subjective:    Patient ID: Julia Moreno, female    DOB: 12/25/1950, 62 y.o.   MRN: [id number]  HPI  Very pleasant, healthy, 62 yo female here for 3 years of left buttock pain that radiates down leg. Also having bilateral neck and knee pain. No hand, wrist pain.  Does have foot pain.  Pain is the worst in her left buttocks. Pain is aggravated by sitting and standing.  No erythema or joint swelling.  No known family h/lo rheum issues.  Patient Active Problem List   Diagnosis Date Noted  . Polyarthralgia 03/18/2013  . Piriformis syndrome of left side 03/18/2013  . HYPERTHYROIDISM 06/22/2010  . HYPERLIPIDEMIA 06/22/2010  . HYPERTENSION 06/22/2010  . DIVERTICULOSIS, COLON 06/22/2010   Past Medical History  Diagnosis Date  . Diverticulosis of colon   . Hypertension   . Hyperlipidemia   . Thyroid disease   . Liver cyst    Past Surgical History  Procedure Laterality Date  . Tonsillectomy    . Bone spur      REMOVED FROM BOTHE BIG TOES 2010   History  Substance Use Topics  . Smoking status: Never Smoker   . Smokeless tobacco: Not on file  . Alcohol Use: Yes   Family History  Problem Relation Age of Onset  . Hyperlipidemia Mother   . Hypertension Mother   . Dementia Father    Allergies  Allergen Reactions  . Meperidine Hcl     REACTION: vomiting  . Sulfamethoxazole W-Trimethoprim     REACTION: hives   Current Outpatient Prescriptions on File Prior to Visit  Medication Sig Dispense Refill  . Calcium Carb-Cholecalciferol (CALCIUM 500 +D) 500-400 MG-UNIT TABS Take 1 capsule by mouth at bedtime.        . lovastatin (MEVACOR) 40 MG tablet TAKE ONE TABLET BY MOUTH AT BEDTIME  30 tablet  11  . methimazole (TAPAZOLE) 5 MG tablet Take 5 mg by mouth daily.      . Probiotic Product (PROBIOTIC PO) Take by mouth.      . fluticasone (FLONASE) 50 MCG/ACT nasal spray PLACE 1 SPRAY INTO THE NOSE DAILY.  16 g  3  . MILK THISTLE EXTRACT PO Take by mouth. Take 250 mg's by mouth  daily       No current facility-administered medications on file prior to visit.   The PMH, PSH, Social History, Family History, Medications, and allergies have been reviewed in [facility], and have been updated if relevant.    Review of Systems See HPI    Objective:   Physical Exam BP 112/74  Pulse 67  Temp(Src) 98.2 F (36.8 C) (Oral)  Wt 171 lb 8 oz (77.792 kg)  BMI 33.23 kg/m2  SpO2 96% Gen:  Alert, pleasant, NAD MSK: + faber left, + SLR left No erythema or gross swelling of joints Normal gait No LE edema    Assessment & Plan:  1. Polyarthralgia Probable OA but will check rheum panel. - Sedimentation Rate - High sensitivity CRP - ANA - Rheumatoid Factor - Cyclic Citrul Peptide Antibody, IGG - CBC with Differential - Comprehensive metabolic panel  2. Piriformis syndrome of left side Consistent with piriformis syndrome. Refer for PT. Continue massage, NSAIDs. Lumbar xray today. - DG Lumbar Spine Complete; Future - Ambulatory referral to Physical Therapy

## 2013-03-18 NOTE — Patient Instructions (Addendum)
Great to see you. We will call you with your lab and xray results.  Please stop by to see Shirlee Limerick on your way out to set up your referral.  Piriformis Syndrome Piriformis syndrome is a rare neuromuscular disorder. It happens when the piriformis muscle compresses or irritates the sciatic nerve. This is the largest nerve in the body. The piriformis muscle is a narrow muscle. It is located in the buttocks.  SYMPTOMS  Compression of the sciatic nerve causes pain. It is often described as tingling or numbness: In the buttocks. Along the nerve. Down to the leg. The pain may get worse as a result of: Sitting for a long period of time. Climbing stairs. Walking or running. TREATMENT  Generally, treatment for the disorder begins with stretching exercises and massage. Anti-inflammatory drugs may be prescribed. A patient may be advised to stop running, bicycling, or similar activities. A corticosteroid injection near where the piriformis muscle and the sciatic nerve meet may provide temporary relief. In some cases, surgery is recommended. The outcome for most individuals with this syndrome is good. Once symptoms of the disorder are addressed, individuals can usually resume their normal activities. In some cases, exercise regimens may need to be modified. This is in order to reduce the likelihood of recurrence or worsening. Document Released: 09/21/2002 Document Revised: 04/01/2012 Document Reviewed: 10/01/2005 [facility] Patient Information 2014 [place], [place].

## 2013-03-19 ENCOUNTER — Encounter: Payer: Self-pay | Admitting: *Deleted

## 2013-07-24 ENCOUNTER — Other Ambulatory Visit: Payer: Self-pay | Admitting: Family Medicine

## 2013-07-24 DIAGNOSIS — E785 Hyperlipidemia, unspecified: Secondary | ICD-10-CM

## 2013-07-24 DIAGNOSIS — I10 Essential (primary) hypertension: Secondary | ICD-10-CM

## 2013-07-24 DIAGNOSIS — Z0000 Encounter for general adult medical examination without abnormal findings: Secondary | ICD-10-CM

## 2013-07-24 DIAGNOSIS — E0590 Thyrotoxicosis, unspecified without thyrotoxic crisis or storm: Secondary | ICD-10-CM

## 2013-08-06 ENCOUNTER — Other Ambulatory Visit: Payer: BC Managed Care – PPO

## 2013-08-12 ENCOUNTER — Encounter: Payer: BC Managed Care – PPO | Admitting: Family Medicine

## 2013-08-13 ENCOUNTER — Other Ambulatory Visit: Payer: Self-pay | Admitting: Family Medicine

## 2013-08-18 ENCOUNTER — Other Ambulatory Visit: Payer: Self-pay | Admitting: Family Medicine

## 2013-08-20 ENCOUNTER — Other Ambulatory Visit: Payer: Self-pay

## 2013-08-25 ENCOUNTER — Other Ambulatory Visit (INDEPENDENT_AMBULATORY_CARE_PROVIDER_SITE_OTHER): Payer: BC Managed Care – PPO

## 2013-08-25 DIAGNOSIS — I10 Essential (primary) hypertension: Secondary | ICD-10-CM

## 2013-08-25 DIAGNOSIS — Z0000 Encounter for general adult medical examination without abnormal findings: Secondary | ICD-10-CM

## 2013-08-25 DIAGNOSIS — E0590 Thyrotoxicosis, unspecified without thyrotoxic crisis or storm: Secondary | ICD-10-CM

## 2013-08-25 DIAGNOSIS — E785 Hyperlipidemia, unspecified: Secondary | ICD-10-CM

## 2013-08-25 LAB — COMPREHENSIVE METABOLIC PANEL
Albumin: 4.2 g/dL (ref 3.5–5.2)
Alkaline Phosphatase: 71 U/L (ref 39–117)
BUN: 20 mg/dL (ref 6–23)
CO2: 27 mEq/L (ref 19–32)
Creatinine, Ser: 0.8 mg/dL (ref 0.4–1.2)
GFR: 75.18 mL/min (ref >60.00)
Glucose, Bld: 93 mg/dL (ref 70–99)
Sodium: 138 mEq/L (ref 135–145)
Total Bilirubin: 1 mg/dL (ref 0.3–1.2)

## 2013-08-25 LAB — CBC WITH DIFFERENTIAL/PLATELET
Basophils Relative: 0.7 % (ref 0.0–3.0)
Eosinophils Relative: 1.7 % (ref 0.0–5.0)
HCT: 41.4 % (ref 36.0–46.0)
Hemoglobin: 14.5 g/dL (ref 12.0–15.0)
Lymphocytes Relative: 46.1 % — ABNORMAL HIGH (ref 12.0–46.0)
Lymphs Abs: 2.3 10*3/uL (ref 0.7–4.0)
MCV: 89.7 fl (ref 78.0–100.0)
Monocytes Absolute: 0.4 10*3/uL (ref 0.1–1.0)
Neutro Abs: 2.2 10*3/uL (ref 1.4–7.7)
Platelets: 184 10*3/uL (ref 150.0–400.0)
RDW: 11.8 % (ref 11.5–14.6)
WBC: 5 10*3/uL (ref 4.5–10.5)

## 2013-08-25 LAB — LIPID PANEL
Cholesterol: 160 mg/dL (ref 0–200)
HDL: 45.2 mg/dL (ref >39.00)
VLDL: 23.6 mg/dL (ref 0.0–40.0)

## 2013-09-04 ENCOUNTER — Encounter: Payer: Self-pay | Admitting: Internal Medicine

## 2013-09-04 ENCOUNTER — Encounter: Payer: Self-pay | Admitting: *Deleted

## 2013-09-04 ENCOUNTER — Ambulatory Visit (INDEPENDENT_AMBULATORY_CARE_PROVIDER_SITE_OTHER): Payer: BC Managed Care – PPO | Admitting: Internal Medicine

## 2013-09-04 VITALS — BP 110/70 | HR 77 | Temp 97.5°F | Ht 60.0 in | Wt 163.0 lb

## 2013-09-04 DIAGNOSIS — Z0000 Encounter for general adult medical examination without abnormal findings: Secondary | ICD-10-CM

## 2013-09-04 DIAGNOSIS — E785 Hyperlipidemia, unspecified: Secondary | ICD-10-CM

## 2013-09-04 NOTE — Assessment & Plan Note (Signed)
Will cut lovastatin down to 20 mg  RTC in 2 months to recheck lipids If looks good will stop lovastatin

## 2013-09-04 NOTE — Progress Notes (Signed)
Subjective:    Patient ID: Julia Moreno, female    DOB: Aug 25, 1951, 62 y.o.   MRN: [id number]  HPI  Pt presents to the clinic today for her annual exam. She does have some concerns about a raised area on her back, that did have pus draining from it about 6 months ago. She has been seem by the dermatologist who said it was a sebaceous cyst but she would like to have it looked at again.  Flu: never Tetanus: unsure of date Pap Smear: 2013 Mammogram: 08/22/2013 Colonoscopy: 2011 Bone Scan: 11 years ago Eye Doctor: as needed Dentist: bianually  Review of Systems  Past Medical History  Diagnosis Date  . Diverticulosis of colon   . Hypertension   . Hyperlipidemia   . Thyroid disease   . Liver cyst     Current Outpatient Prescriptions  Medication Sig Dispense Refill  . Calcium Carb-Cholecalciferol (CALCIUM 500 +D) 500-400 MG-UNIT TABS Take 1 capsule by mouth at bedtime.        . fluticasone (FLONASE) 50 MCG/ACT nasal spray USE 1 SPRAY INTO THE NOSE DAILY.  16 g  1  . lovastatin (MEVACOR) 40 MG tablet TAKE ONE TABLET BY MOUTH AT BEDTIME  30 tablet  0  . magnesium gluconate (MAGONATE) 500 MG tablet Take 500 mg by mouth daily.      . methimazole (TAPAZOLE) 5 MG tablet Take 5 mg by mouth daily.      Marland Kitchen MILK THISTLE EXTRACT PO Take by mouth. Take 250 mg's by mouth daily      . Multiple Vitamin (MULTIVITAMIN) tablet Take 1 tablet by mouth daily.      . Probiotic Product (PROBIOTIC PO) Take by mouth.       No current facility-administered medications for this visit.    Allergies  Allergen Reactions  . Meperidine Hcl     REACTION: vomiting  . Sulfamethoxazole-Trimethoprim     REACTION: hives    Family History  Problem Relation Age of Onset  . Hyperlipidemia Mother   . Hypertension Mother   . Dementia Father     History   Social History  . Marital Status: Married    Spouse Name: N/A    Number of Children: N/A  . Years of Education: N/A   Occupational History  . Not  on file.   Social History Main Topics  . Smoking status: Never Smoker   . Smokeless tobacco: Never Used  . Alcohol Use: Yes  . Drug Use: No  . Sexual Activity: Not on file   Other Topics Concern  . Not on file   Social History Narrative   Recently moved here from [place]      Son is internist hospitalist     Constitutional: Denies fever, malaise, fatigue, headache or abrupt weight changes.  HEENT: Denies eye pain, eye redness, ear pain, ringing in the ears, wax buildup, runny nose, nasal congestion, bloody nose, or sore throat. Respiratory: Denies difficulty breathing, shortness of breath, cough or sputum production.   Cardiovascular: Denies chest pain, chest tightness, palpitations or swelling in the hands or feet.  Gastrointestinal: Denies abdominal pain, bloating, constipation, diarrhea or blood in the stool.  GU: Denies urgency, frequency, pain with urination, burning sensation, blood in urine, odor or discharge. Musculoskeletal: Pt does report arthritis in back. Denies decrease in range of motion, difficulty with gait, muscle pain or joint pain and swelling.  Skin: Denies redness, rashes, or ulcercations.  Neurological: Denies dizziness, difficulty with memory, difficulty with speech  or problems with balance and coordination.   No other specific complaints in a complete review of systems (except as listed in HPI above).     Objective:   Physical Exam   BP 110/70  Pulse 77  Temp(Src) 97.5 F (36.4 C) (Tympanic)  Ht 5' (1.524 m)  Wt 163 lb (73.936 kg)  BMI 31.83 kg/m2  SpO2 98% Wt Readings from Last 3 Encounters:  09/04/13 163 lb (73.936 kg)  03/18/13 171 lb 8 oz (77.792 kg)  07/07/12 174 lb (78.926 kg)    General: Appears her stated age, overweight but well developed, well nourished in NAD. Skin: Warm, dry and intact. No rashes, lesions or ulcerations noted. Sebaceous cyst, not infected, noted on middle upper back. HEENT: Head: normal shape and size; Eyes: sclera  white, no icterus, conjunctiva pink, PERRLA and EOMs intact; Ears: Tm's gray and intact, normal light reflex; Nose: mucosa pink and moist, septum midline; Throat/Mouth: Teeth present, mucosa pink and moist, no exudate, lesions or ulcerations noted.  Neck: Normal range of motion. Neck supple, trachea midline. No massses, lumps or thyromegaly present.  Cardiovascular: Normal rate and rhythm. S1,S2 noted.  No murmur, rubs or gallops noted. No JVD or BLE edema. No carotid bruits noted. Pulmonary/Chest: Normal effort and positive vesicular breath sounds. No respiratory distress. No wheezes, rales or ronchi noted.  Abdomen: Soft and nontender. Normal bowel sounds, no bruits noted. No distention or masses noted. Liver, spleen and kidneys non palpable. Musculoskeletal: Normal range of motion. No signs of joint swelling. No difficulty with gait.  Neurological: Alert and oriented. Cranial nerves II-XII intact. Coordination normal. +DTRs bilaterally. Psychiatric: Mood and affect normal. Behavior is normal. Judgment and thought content normal.     BMET    Component Value Date/Time   NA 138 08/25/2013 0835   K 4.1 08/25/2013 0835   CL 105 08/25/2013 0835   CO2 27 08/25/2013 0835   GLUCOSE 93 08/25/2013 0835   BUN 20 08/25/2013 0835   CREATININE 0.8 08/25/2013 0835   CALCIUM 9.5 08/25/2013 0835    Lipid Panel     Component Value Date/Time   CHOL 160 08/25/2013 0835   TRIG 118.0 08/25/2013 0835   HDL 45.20 08/25/2013 0835   CHOLHDL 4 08/25/2013 0835   VLDL 23.6 08/25/2013 0835   LDLCALC 91 08/25/2013 0835    CBC    Component Value Date/Time   WBC 5.0 08/25/2013 0835   RBC 4.62 08/25/2013 0835   HGB 14.5 08/25/2013 0835   HCT 41.4 08/25/2013 0835   PLT 184.0 08/25/2013 0835   MCV 89.7 08/25/2013 0835   MCHC 35.0 08/25/2013 0835   RDW 11.8 08/25/2013 0835   LYMPHSABS 2.3 08/25/2013 0835   MONOABS 0.4 08/25/2013 0835   EOSABS 0.1 08/25/2013 0835   BASOSABS 0.0 08/25/2013 0835    Hgb  A1C No results found for this basename: HGBA1C        Assessment & Plan:   Preventative Health Maintenance:  Pt declines flu vaccine today Will go ahead and schedule her mammogram Labs reviewed and looks good!  RTC in 1 year or sooner if needed

## 2013-09-04 NOTE — Patient Instructions (Signed)

## 2013-09-04 NOTE — Progress Notes (Signed)
Pre-visit discussion using our clinic review tool. No additional management support is needed unless otherwise documented below in the visit note.  

## 2013-09-14 ENCOUNTER — Ambulatory Visit: Payer: Self-pay | Admitting: Family Medicine

## 2013-09-15 ENCOUNTER — Encounter: Payer: Self-pay | Admitting: Family Medicine

## 2013-10-06 ENCOUNTER — Other Ambulatory Visit: Payer: Self-pay | Admitting: Family Medicine

## 2013-11-04 ENCOUNTER — Other Ambulatory Visit: Payer: BC Managed Care – PPO

## 2013-11-05 ENCOUNTER — Other Ambulatory Visit (INDEPENDENT_AMBULATORY_CARE_PROVIDER_SITE_OTHER): Payer: BC Managed Care – PPO

## 2013-11-05 DIAGNOSIS — E785 Hyperlipidemia, unspecified: Secondary | ICD-10-CM

## 2013-11-05 LAB — LIPID PANEL
Cholesterol: 191 mg/dL (ref 0–200)
HDL: 52 mg/dL (ref >39.00)
LDL Cholesterol: 114 mg/dL — ABNORMAL HIGH (ref 0–99)
Total CHOL/HDL Ratio: 4
Triglycerides: 124 mg/dL (ref 0.0–149.0)
VLDL: 24.8 mg/dL (ref 0.0–40.0)

## 2013-12-02 ENCOUNTER — Telehealth: Payer: Self-pay

## 2013-12-02 NOTE — Telephone Encounter (Signed)
I did cut in half to 20 mg daily. She was instructed to return at the end of January to have her lipid profile repeated. She needs to return for an OV

## 2013-12-02 NOTE — Telephone Encounter (Signed)
Will route to [facility] since she has been managing this.

## 2013-12-02 NOTE — Telephone Encounter (Signed)
Pt request advice if pt should continue taking lovastatin 40 mg taking 1/2 tab daily; pt has been taking lovastatin 20 mg for last 2 months per instructions nurse practitioner.On pt med list lovastatin 40 mg one daily. Please advise. [facility]. Pt request cb.

## 2013-12-02 NOTE — Telephone Encounter (Signed)
She has had a moderate increase in cholesterol. She should go back up to 40 mg daily

## 2013-12-02 NOTE — Telephone Encounter (Signed)
Pt is aware and will be taking 40mg  qhs

## 2013-12-02 NOTE — Telephone Encounter (Signed)
Pt did come back and had her lipid redrawn on 11/05/13--please review and advise me to what i should convey to pt in reference to her dose on anti lipid medication

## 2014-05-10 ENCOUNTER — Other Ambulatory Visit: Payer: Self-pay | Admitting: Internal Medicine

## 2014-07-13 ENCOUNTER — Other Ambulatory Visit: Payer: Self-pay | Admitting: Family Medicine

## 2014-07-13 ENCOUNTER — Other Ambulatory Visit (INDEPENDENT_AMBULATORY_CARE_PROVIDER_SITE_OTHER): Payer: Managed Care, Other (non HMO)

## 2014-07-13 DIAGNOSIS — E0590 Thyrotoxicosis, unspecified without thyrotoxic crisis or storm: Secondary | ICD-10-CM

## 2014-07-13 DIAGNOSIS — I10 Essential (primary) hypertension: Secondary | ICD-10-CM

## 2014-07-13 DIAGNOSIS — Z0000 Encounter for general adult medical examination without abnormal findings: Secondary | ICD-10-CM

## 2014-07-13 DIAGNOSIS — E785 Hyperlipidemia, unspecified: Secondary | ICD-10-CM

## 2014-07-13 LAB — LIPID PANEL
Cholesterol: 200 mg/dL (ref 0–200)
HDL: 44.7 mg/dL (ref >39.00)
LDL Cholesterol: 123 mg/dL — ABNORMAL HIGH (ref 0–99)
NonHDL: 155.3
Total CHOL/HDL Ratio: 4
Triglycerides: 161 mg/dL — ABNORMAL HIGH (ref 0.0–149.0)
VLDL: 32.2 mg/dL (ref 0.0–40.0)

## 2014-07-13 LAB — COMPREHENSIVE METABOLIC PANEL
ALBUMIN: 4.1 g/dL (ref 3.5–5.2)
ALK PHOS: 69 U/L (ref 39–117)
ALT: 33 U/L (ref 0–35)
AST: 17 U/L (ref 0–37)
BUN: 21 mg/dL (ref 6–23)
CO2: 26 mEq/L (ref 19–32)
Calcium: 9.5 mg/dL (ref 8.4–10.5)
Chloride: 105 mEq/L (ref 96–112)
Creatinine, Ser: 0.9 mg/dL (ref 0.4–1.2)
GFR: 70.02 mL/min (ref >60.00)
Glucose, Bld: 94 mg/dL (ref 70–99)
POTASSIUM: 4.3 meq/L (ref 3.5–5.1)
SODIUM: 139 meq/L (ref 135–145)
TOTAL PROTEIN: 6.5 g/dL (ref 6.0–8.3)
Total Bilirubin: 0.7 mg/dL (ref 0.2–1.2)

## 2014-07-13 LAB — CBC WITH DIFFERENTIAL/PLATELET
BASOS ABS: 0 10*3/uL (ref 0.0–0.1)
Basophils Relative: 0.5 % (ref 0.0–3.0)
EOS ABS: 0.1 10*3/uL (ref 0.0–0.7)
Eosinophils Relative: 2 % (ref 0.0–5.0)
HCT: 41.7 % (ref 36.0–46.0)
Hemoglobin: 14.4 g/dL (ref 12.0–15.0)
Lymphocytes Relative: 40.5 % (ref 12.0–46.0)
Lymphs Abs: 2.1 10*3/uL (ref 0.7–4.0)
MCHC: 34.5 g/dL (ref 30.0–36.0)
MCV: 91.1 fl (ref 78.0–100.0)
MONO ABS: 0.3 10*3/uL (ref 0.1–1.0)
Monocytes Relative: 5.5 % (ref 3.0–12.0)
Neutro Abs: 2.6 10*3/uL (ref 1.4–7.7)
Neutrophils Relative %: 51.5 % (ref 43.0–77.0)
PLATELETS: 190 10*3/uL (ref 150.0–400.0)
RBC: 4.57 Mil/uL (ref 3.87–5.11)
RDW: 12 % (ref 11.5–15.5)
WBC: 5.1 10*3/uL (ref 4.0–10.5)

## 2014-07-13 LAB — TSH: TSH: 2.24 u[IU]/mL (ref 0.35–4.50)

## 2014-07-13 LAB — T4, FREE: FREE T4: 0.91 ng/dL (ref 0.60–1.60)

## 2014-07-20 ENCOUNTER — Ambulatory Visit (INDEPENDENT_AMBULATORY_CARE_PROVIDER_SITE_OTHER): Payer: Managed Care, Other (non HMO) | Admitting: Family Medicine

## 2014-07-20 ENCOUNTER — Other Ambulatory Visit (HOSPITAL_COMMUNITY)
Admission: RE | Admit: 2014-07-20 | Discharge: 2014-07-20 | Disposition: A | Discharge status: Home / Self Care | Payer: Managed Care, Other (non HMO) | Source: Ambulatory Visit | Attending: Family Medicine | Admitting: Family Medicine

## 2014-07-20 ENCOUNTER — Encounter: Payer: Self-pay | Admitting: Family Medicine

## 2014-07-20 VITALS — BP 104/64 | HR 66 | Temp 97.8°F | Ht 59.75 in | Wt 167.8 lb

## 2014-07-20 DIAGNOSIS — E785 Hyperlipidemia, unspecified: Secondary | ICD-10-CM

## 2014-07-20 DIAGNOSIS — E0590 Thyrotoxicosis, unspecified without thyrotoxic crisis or storm: Secondary | ICD-10-CM

## 2014-07-20 DIAGNOSIS — Z01419 Encounter for gynecological examination (general) (routine) without abnormal findings: Secondary | ICD-10-CM | POA: Diagnosis present

## 2014-07-20 DIAGNOSIS — M542 Cervicalgia: Secondary | ICD-10-CM | POA: Insufficient documentation

## 2014-07-20 DIAGNOSIS — Z1151 Encounter for screening for human papillomavirus (HPV): Secondary | ICD-10-CM | POA: Diagnosis present

## 2014-07-20 DIAGNOSIS — I10 Essential (primary) hypertension: Secondary | ICD-10-CM

## 2014-07-20 DIAGNOSIS — Z23 Encounter for immunization: Secondary | ICD-10-CM

## 2014-07-20 DIAGNOSIS — G8929 Other chronic pain: Secondary | ICD-10-CM

## 2014-07-20 DIAGNOSIS — Z0000 Encounter for general adult medical examination without abnormal findings: Secondary | ICD-10-CM

## 2014-07-20 NOTE — Assessment & Plan Note (Signed)
Well controlled on current rx. No changes made today. 

## 2014-07-20 NOTE — Progress Notes (Signed)
Subjective:    Patient ID: Julia Moreno, female    DOB: 04/25/1951, 63 y.o.   MRN: [id number]  HPI  63 yo pleasant female here for CPX.  Saw Webb Silversmith in 08/2013 for CPX as well.  Has changed insurance carriers and would like CPX today.   Flu: never Tetanus: unsure of date Pap Smear: 06/26/11 (done by me). No h/o abnormal pap smears or post menopausal bleeding. Mammogram: 08/22/2013 Colonoscopy: 2011 Bone Scan: 11 years ago Eye Doctor: as needed Dentist: bianually  She is having some more neck pain with left sided radiculopathy.  She is going for massage which helps but this has been ongoing for years- flares up.  She is very active- plays golf, does yoga sometimes up to twice a day.  History of OA.  Rheum labs neg last year. No UE weakness.  Wt Readings from Last 3 Encounters:  07/20/14 167 lb 12 oz (76.091 kg)  09/04/13 163 lb (73.936 kg)  03/18/13 171 lb 8 oz (77.792 kg)     HLD-  Lab Results  Component Value Date   CHOL 200 07/13/2014   CHOL 191 11/05/2013   CHOL 160 08/25/2013   Lab Results  Component Value Date   HDL 44.70 07/13/2014   HDL 52.00 11/05/2013   HDL 45.20 08/25/2013   Lab Results  Component Value Date   LDLCALC 123* 07/13/2014   LDLCALC 114* 11/05/2013   LDLCALC 91 08/25/2013   Lab Results  Component Value Date   TRIG 161.0* 07/13/2014   TRIG 124.0 11/05/2013   TRIG 118.0 08/25/2013   Lab Results  Component Value Date   ALT 33 07/13/2014   AST 17 07/13/2014   ALKPHOS 69 07/13/2014   BILITOT 0.7 07/13/2014    Has been on Lovastatin 40 mg for years. Has h/o liver cyst but no elevated LFTs. Admits to eating more sweets lately.  Lab Results  Component Value Date   CHOL 200 07/13/2014   HDL 44.70 07/13/2014   LDLCALC 123* 07/13/2014   LDLDIRECT 122.9 06/21/2011   TRIG 161.0* 07/13/2014   CHOLHDL 4 07/13/2014     Hyperthyroidism-  Has endocrinologist. Biopsy neg, told it was thyroiditis. Lab Results  Component Value Date   TSH 2.24  07/13/2014   Patient Active Problem List   Diagnosis Date Noted  . Routine general medical examination at a health care facility 07/24/2013  . Polyarthralgia 03/18/2013  . Piriformis syndrome of left side 03/18/2013  . Thyrotoxicosis 06/22/2010  . HLD (hyperlipidemia) 06/22/2010  . Essential hypertension 06/22/2010  . DIVERTICULOSIS, COLON 06/22/2010    History  Substance Use Topics  . Smoking status: Never Smoker   . Smokeless tobacco: Never Used  . Alcohol Use: Yes   Family History  Problem Relation Age of Onset  . Hyperlipidemia Mother   . Hypertension Mother   . Dementia Father    Allergies  Allergen Reactions  . Meperidine Hcl     REACTION: vomiting  . Sulfamethoxazole-Trimethoprim     REACTION: hives   Current Outpatient Prescriptions on File Prior to Visit  Medication Sig Dispense Refill  . Calcium Carb-Cholecalciferol (CALCIUM 500 +D) 500-400 MG-UNIT TABS Take 1 capsule by mouth at bedtime.        . fluticasone (FLONASE) 50 MCG/ACT nasal spray USE 1 SPRAY INTO THE NOSE DAILY.  16 g  1  . lovastatin (MEVACOR) 40 MG tablet TAKE ONE TABLET BY MOUTH AT BEDTIME  30 tablet  3  . magnesium gluconate (MAGONATE) 500  MG tablet Take 500 mg by mouth daily.      . methimazole (TAPAZOLE) 5 MG tablet Take 5 mg by mouth daily.      Marland Kitchen MILK THISTLE EXTRACT PO Take by mouth. Take 250 mg's by mouth daily      . Multiple Vitamin (MULTIVITAMIN) tablet Take 1 tablet by mouth daily.      . Probiotic Product (PROBIOTIC PO) Take by mouth.       No current facility-administered medications on file prior to visit.   The PMH, PSH, Social History, Family History, Medications, and allergies have been reviewed in [facility], and have been updated if relevant.   Review of Systems See HPI Patient reports no  vision/ hearing changes,anorexia, weight change, fever ,adenopathy, persistant / recurrent hoarseness, swallowing issues, chest pain, edema,persistant / recurrent cough, hemoptysis, dyspnea(rest,  exertional, paroxysmal nocturnal), gastrointestinal  bleeding (melena, rectal bleeding), abdominal pain, excessive heart burn, GU symptoms(dysuria, hematuria, pyuria, voiding/incontinence  Issues) syncope, focal weakness, severe memory loss, concerning skin lesions, depression, anxiety, abnormal bruising/bleeding, major joint swelling, breast masses or abnormal vaginal bleeding.       Objective:   Physical Exam BP 104/64  Pulse 66  Temp(Src) 97.8 F (36.6 C) (Oral)  Ht 4' 11.75" (1.518 m)  Wt 167 lb 12 oz (76.091 kg)  BMI 33.02 kg/m2  SpO2 98%  General:  Well-developed,well-nourished,in no acute distress; alert,appropriate and cooperative throughout examination Head:  normocephalic and atraumatic.   Eyes:  vision grossly intact, pupils equal, pupils round, and pupils reactive to light.   Ears:  R ear normal and L ear normal.   Nose:  no external deformity.   Mouth:  good dentition.   Neck:  No deformities, masses, or tenderness noted. Breasts:  No mass, nodules, thickening, tenderness, bulging, retraction, inflamation, nipple discharge or skin changes noted.   Lungs:  Normal respiratory effort, chest expands symmetrically. Lungs are clear to auscultation, no crackles or wheezes. Heart:  Normal rate and regular rhythm. S1 and S2 normal without gallop, murmur, click, rub or other extra sounds. Abdomen:  Bowel sounds positive,abdomen soft and non-tender without masses, organomegaly or hernias noted. Rectal:  no external abnormalities.   Genitalia:  Pelvic Exam:        External: normal female genitalia without lesions or masses        Vagina: normal without lesions or masses        Cervix: normal without lesions or masses        Adnexa: normal bimanual exam without masses or fullness        Uterus: normal by palpation        Pap smear: performed Msk:  No deformity or scoliosis noted of thoracic or lumbar spine.   Extremities:  No clubbing, cyanosis, edema, or deformity noted with normal  full range of motion of all joints. FROM of neck, normal grip strength bilaterally   Neurologic:  alert & oriented X3 and gait normal.    Skin:  Intact without suspicious lesions or rashes Cervical Nodes:  No lymphadenopathy noted Axillary Nodes:  No palpable lymphadenopathy Psych:  Cognition and judgment appear intact. Alert and cooperative with normal attention span and concentration. No apparent delusions, illusions, hallucinations    Assessment & Plan:

## 2014-07-20 NOTE — Progress Notes (Signed)
Pre visit review using our clinic review tool, if applicable. No additional management support is needed unless otherwise documented below in the visit note. 

## 2014-07-20 NOTE — Assessment & Plan Note (Signed)
Euthyroid, followed by endocrinology. No changes made today.

## 2014-07-20 NOTE — Assessment & Plan Note (Signed)
Pap smear done today. Mammogram UTD.

## 2014-07-20 NOTE — Progress Notes (Deleted)
   Subjective:   Patient ID: Julia Moreno, female    DOB: [birth year], 63 y.o.   MRN: [id number]  Julia Moreno is a pleasant 63 y.o. year old female who presents to clinic today with Annual Exam and Neck Pain  on 07/20/2014  HPI: ***  Review of Systems     Objective:    BP 104/64  Pulse 66  Temp(Src) 97.8 F (36.6 C) (Oral)  Ht 4' 11.75" (1.518 m)  Wt 167 lb 12 oz (76.091 kg)  BMI 33.02 kg/m2  SpO2 98%   Physical Exam        Assessment & Plan:   No diagnosis found. No Follow-up on file.

## 2014-07-20 NOTE — Patient Instructions (Signed)
Great to see you. You can return to have your xray done.  We will call you or send a letter with your pap smear results.

## 2014-07-20 NOTE — Assessment & Plan Note (Signed)
Reviewed preventive care protocols, scheduled due services, and updated immunizations Discussed nutrition, exercise, diet, and healthy lifestyle.  IFOB refused. Zostavax and Tdap (has small grandchildren and wanted pertussis coverage), given today.

## 2014-07-21 ENCOUNTER — Ambulatory Visit (INDEPENDENT_AMBULATORY_CARE_PROVIDER_SITE_OTHER)
Admission: RE | Admit: 2014-07-21 | Discharge: 2014-07-21 | Disposition: A | Discharge status: Home / Self Care | Payer: Managed Care, Other (non HMO) | Source: Ambulatory Visit | Attending: Family Medicine | Admitting: Family Medicine

## 2014-07-21 ENCOUNTER — Telehealth: Payer: Self-pay | Admitting: Family Medicine

## 2014-07-21 DIAGNOSIS — M542 Cervicalgia: Secondary | ICD-10-CM

## 2014-07-21 DIAGNOSIS — G8929 Other chronic pain: Secondary | ICD-10-CM

## 2014-07-21 NOTE — Telephone Encounter (Signed)
emmi emailed °

## 2014-07-22 LAB — CYTOLOGY - PAP

## 2014-09-06 ENCOUNTER — Other Ambulatory Visit: Payer: Self-pay | Admitting: *Deleted

## 2014-09-06 MED ORDER — LOVASTATIN 40 MG PO TABS: 40.0000 mg | ORAL_TABLET | Freq: Every day | ORAL | Status: DC

## 2014-09-29 ENCOUNTER — Ambulatory Visit: Payer: Self-pay | Admitting: Family Medicine

## 2014-09-30 ENCOUNTER — Encounter: Payer: Self-pay | Admitting: Family Medicine

## 2014-10-09 ENCOUNTER — Other Ambulatory Visit: Payer: Self-pay | Admitting: Family Medicine

## 2014-10-12 ENCOUNTER — Encounter: Payer: Self-pay | Admitting: Internal Medicine

## 2014-10-12 ENCOUNTER — Ambulatory Visit (INDEPENDENT_AMBULATORY_CARE_PROVIDER_SITE_OTHER): Payer: Managed Care, Other (non HMO) | Admitting: Internal Medicine

## 2014-10-12 VITALS — BP 120/74 | HR 80 | Temp 97.9°F | Wt 170.0 lb

## 2014-10-12 DIAGNOSIS — B9789 Other viral agents as the cause of diseases classified elsewhere: Secondary | ICD-10-CM

## 2014-10-12 DIAGNOSIS — B349 Viral infection, unspecified: Secondary | ICD-10-CM

## 2014-10-12 DIAGNOSIS — J329 Chronic sinusitis, unspecified: Secondary | ICD-10-CM

## 2014-10-12 MED ORDER — AMOXICILLIN-POT CLAVULANATE 875-125 MG PO TABS: 1.0000 | ORAL_TABLET | Freq: Two times a day (BID) | ORAL | Status: DC

## 2014-10-12 MED ORDER — METHYLPREDNISOLONE ACETATE 80 MG/ML IJ SUSP
80.0000 mg | Freq: Once | INTRAMUSCULAR | Status: AC
  Administered 2014-10-12: 80 mg via INTRAMUSCULAR

## 2014-10-12 MED ORDER — FLUTICASONE PROPIONATE 50 MCG/ACT NA SUSP: NASAL | Status: DC

## 2014-10-12 NOTE — Progress Notes (Signed)
HPI  Pt presents to the clinic today with c/o headache, facial pain and pressure and runny nose. She reports this started about 5 days ago. She is not blowing anything out of her nose. She denies fever, chills or body aches. She has tried Sudafed and Advil cold and sinus and reports she is feeling some better today. She does have a history of allergies but denies breathing problems. She has not had sick contacts. She is going out of town tomorrow to [place] and wanted to get checked before she left.  Review of Systems    Past Medical History  Diagnosis Date  . Diverticulosis of colon   . Hypertension   . Hyperlipidemia   . Thyroid disease   . Liver cyst     Family History  Problem Relation Age of Onset  . Hyperlipidemia Mother   . Hypertension Mother   . Dementia Father     History   Social History  . Marital Status: Married    Spouse Name: N/A    Number of Children: N/A  . Years of Education: N/A   Occupational History  . Not on file.   Social History Main Topics  . Smoking status: Never Smoker   . Smokeless tobacco: Never Used  . Alcohol Use: Yes  . Drug Use: No  . Sexual Activity: Not on file   Other Topics Concern  . Not on file   Social History Narrative   Recently moved here from [place]      Son is internist hospitalist    Allergies  Allergen Reactions  . Meperidine Hcl     REACTION: vomiting  . Sulfamethoxazole-Trimethoprim     REACTION: hives     Constitutional: Positive headache, fatigue. Denies fever or abrupt weight changes.  HEENT:  Positive facial pain, nasal congestion and sore throat. Denies eye redness, ear pain, ringing in the ears, wax buildup, runny nose or bloody nose. Respiratory: Positive cough. Denies difficulty breathing or shortness of breath.  Cardiovascular: Denies chest pain, chest tightness, palpitations or swelling in the hands or feet.   No other specific complaints in a complete review of systems (except as listed in HPI  above).  Objective:   BP 120/74 mmHg  Pulse 80  Temp(Src) 97.9 F (36.6 C) (Oral)  Wt 170 lb (77.111 kg)  SpO2 98%  LMP  (Approximate)  General: Appears her stated age, well developed, well nourished in NAD. HEENT: Head: normal shape and size,  No sinus tenderness noted; Eyes: sclera white, no icterus, conjunctiva pink\; Ears: Tm's pink but intact, normal light reflex, + effusion bilaterally; Nose: mucosa boggy and moist, septum midline; Throat/Mouth: Teeth present, mucosa pink and moist, no exudate noted, no lesions or ulcerations noted.  Neck: No adenopathy noted. Cardiovascular: Normal rate and rhythm. S1,S2 noted.  No murmur, rubs or gallops noted.  Pulmonary/Chest: Normal effort and positive vesicular breath sounds. No respiratory distress. No wheezes, rales or ronchi noted.      Assessment & Plan:   Viral sinusitis  Can use a Neti Pot which can be purchased from your local drug store. Flonase 2 sprays each nostril for 3 days and then as needed- refilled today 80 mg Depo IM today Will print RX for Augmentin BID for 10 days to take with her out of town in case she starts feeling worse  RTC as needed or if symptoms persist.

## 2014-10-12 NOTE — Addendum Note (Signed)
Addended by: Lurlean Nanny on: 10/12/2014 04:02 PM   Modules accepted: Orders

## 2014-10-12 NOTE — Patient Instructions (Signed)

## 2014-10-12 NOTE — Progress Notes (Signed)
Pre visit review using our clinic review tool, if applicable. No additional management support is needed unless otherwise documented below in the visit note. 

## 2015-04-21 ENCOUNTER — Other Ambulatory Visit: Payer: Self-pay | Admitting: Family Medicine

## 2015-07-19 ENCOUNTER — Other Ambulatory Visit: Payer: Self-pay | Admitting: Family Medicine

## 2015-07-19 DIAGNOSIS — Z114 Encounter for screening for human immunodeficiency virus [HIV]: Secondary | ICD-10-CM

## 2015-07-19 DIAGNOSIS — I10 Essential (primary) hypertension: Secondary | ICD-10-CM

## 2015-07-19 DIAGNOSIS — Z1159 Encounter for screening for other viral diseases: Secondary | ICD-10-CM

## 2015-07-19 DIAGNOSIS — E785 Hyperlipidemia, unspecified: Secondary | ICD-10-CM

## 2015-07-20 ENCOUNTER — Other Ambulatory Visit (INDEPENDENT_AMBULATORY_CARE_PROVIDER_SITE_OTHER): Payer: Managed Care, Other (non HMO)

## 2015-07-20 DIAGNOSIS — I10 Essential (primary) hypertension: Secondary | ICD-10-CM | POA: Diagnosis not present

## 2015-07-20 DIAGNOSIS — E785 Hyperlipidemia, unspecified: Secondary | ICD-10-CM | POA: Diagnosis not present

## 2015-07-20 DIAGNOSIS — Z114 Encounter for screening for human immunodeficiency virus [HIV]: Secondary | ICD-10-CM

## 2015-07-20 DIAGNOSIS — Z1159 Encounter for screening for other viral diseases: Secondary | ICD-10-CM

## 2015-07-20 LAB — COMPREHENSIVE METABOLIC PANEL
ALBUMIN: 4.4 g/dL (ref 3.5–5.2)
ALT: 26 U/L (ref 0–35)
AST: 16 U/L (ref 0–37)
Alkaline Phosphatase: 79 U/L (ref 39–117)
BILIRUBIN TOTAL: 0.7 mg/dL (ref 0.2–1.2)
BUN: 14 mg/dL (ref 6–23)
CALCIUM: 9.6 mg/dL (ref 8.4–10.5)
CHLORIDE: 102 meq/L (ref 96–112)
CO2: 29 mEq/L (ref 19–32)
CREATININE: 0.88 mg/dL (ref 0.40–1.20)
GFR: 68.87 mL/min (ref >60.00)
Glucose, Bld: 104 mg/dL — ABNORMAL HIGH (ref 70–99)
Potassium: 4.1 mEq/L (ref 3.5–5.1)
Sodium: 138 mEq/L (ref 135–145)
Total Protein: 7 g/dL (ref 6.0–8.3)

## 2015-07-20 LAB — LIPID PANEL
CHOLESTEROL: 209 mg/dL — AB (ref 0–200)
HDL: 51.1 mg/dL (ref >39.00)
NonHDL: 157.9
TRIGLYCERIDES: 213 mg/dL — AB (ref 0.0–149.0)
Total CHOL/HDL Ratio: 4
VLDL: 42.6 mg/dL — ABNORMAL HIGH (ref 0.0–40.0)

## 2015-07-20 LAB — CBC WITH DIFFERENTIAL/PLATELET
BASOS PCT: 0.5 % (ref 0.0–3.0)
Basophils Absolute: 0 10*3/uL (ref 0.0–0.1)
Eosinophils Absolute: 0.1 10*3/uL (ref 0.0–0.7)
Eosinophils Relative: 1.4 % (ref 0.0–5.0)
HCT: 44.8 % (ref 36.0–46.0)
Hemoglobin: 15.1 g/dL — ABNORMAL HIGH (ref 12.0–15.0)
LYMPHS PCT: 33.1 % (ref 12.0–46.0)
Lymphs Abs: 2 10*3/uL (ref 0.7–4.0)
MCHC: 33.8 g/dL (ref 30.0–36.0)
MCV: 91.2 fl (ref 78.0–100.0)
Monocytes Absolute: 0.4 10*3/uL (ref 0.1–1.0)
Monocytes Relative: 5.9 % (ref 3.0–12.0)
NEUTROS ABS: 3.6 10*3/uL (ref 1.4–7.7)
Neutrophils Relative %: 59.1 % (ref 43.0–77.0)
PLATELETS: 226 10*3/uL (ref 150.0–400.0)
RBC: 4.91 Mil/uL (ref 3.87–5.11)
RDW: 12.2 % (ref 11.5–15.5)
WBC: 6.1 10*3/uL (ref 4.0–10.5)

## 2015-07-20 LAB — TSH: TSH: 2.47 u[IU]/mL (ref 0.35–4.50)

## 2015-07-20 LAB — LDL CHOLESTEROL, DIRECT: Direct LDL: 136 mg/dL

## 2015-07-21 ENCOUNTER — Other Ambulatory Visit: Payer: Managed Care, Other (non HMO)

## 2015-07-21 LAB — HEPATITIS C ANTIBODY: HCV AB: NEGATIVE

## 2015-07-21 LAB — HIV ANTIBODY (ROUTINE TESTING W REFLEX): HIV 1&2 Ab, 4th Generation: NONREACTIVE

## 2015-07-25 ENCOUNTER — Encounter: Payer: Self-pay | Admitting: Family Medicine

## 2015-07-25 ENCOUNTER — Ambulatory Visit (INDEPENDENT_AMBULATORY_CARE_PROVIDER_SITE_OTHER): Payer: Managed Care, Other (non HMO) | Admitting: Family Medicine

## 2015-07-25 VITALS — BP 110/62 | HR 65 | Temp 98.1°F | Ht 59.75 in | Wt 169.5 lb

## 2015-07-25 DIAGNOSIS — Z01419 Encounter for gynecological examination (general) (routine) without abnormal findings: Secondary | ICD-10-CM

## 2015-07-25 DIAGNOSIS — E785 Hyperlipidemia, unspecified: Secondary | ICD-10-CM | POA: Diagnosis not present

## 2015-07-25 DIAGNOSIS — I10 Essential (primary) hypertension: Secondary | ICD-10-CM

## 2015-07-25 DIAGNOSIS — E0590 Thyrotoxicosis, unspecified without thyrotoxic crisis or storm: Secondary | ICD-10-CM

## 2015-07-25 DIAGNOSIS — Z1211 Encounter for screening for malignant neoplasm of colon: Secondary | ICD-10-CM

## 2015-07-25 DIAGNOSIS — B9789 Other viral agents as the cause of diseases classified elsewhere: Secondary | ICD-10-CM

## 2015-07-25 DIAGNOSIS — B349 Viral infection, unspecified: Secondary | ICD-10-CM

## 2015-07-25 DIAGNOSIS — J329 Chronic sinusitis, unspecified: Secondary | ICD-10-CM

## 2015-07-25 DIAGNOSIS — Z0000 Encounter for general adult medical examination without abnormal findings: Secondary | ICD-10-CM

## 2015-07-25 NOTE — Progress Notes (Signed)
Pre visit review using our clinic review tool, if applicable. No additional management support is needed unless otherwise documented below in the visit note. 

## 2015-07-25 NOTE — Patient Instructions (Signed)
Great to see you. Please stop by the lab to pick up your stool cards.

## 2015-07-25 NOTE — Assessment & Plan Note (Signed)
Euthyroid.  No changes made today. 

## 2015-07-25 NOTE — Assessment & Plan Note (Signed)
Reviewed preventive care protocols, scheduled due services, and updated immunizations Discussed nutrition, exercise, diet, and healthy lifestyle.  Declines colonoscopy but willing to do stool cards- given today.  Declines flu shot.

## 2015-07-25 NOTE — Progress Notes (Signed)
Subjective:    Patient ID: Julia Moreno, female    DOB: 1951/04/20, 64 y.o.   MRN: [id number]  HPI  64 yo pleasant female here for CPX and follow up of chronic medical conditions.    Flu: never Tetanus: 07/20/14 Pap Smear: 07/20/14 No h/o abnormal pap smears or post menopausal bleeding. Mammogram: 09/29/14 Colonoscopy: 03/21/2010 Zostavax 07/20/14  Eye Doctor: as needed Dentist: bianually   Wt Readings from Last 3 Encounters:  07/25/15 169 lb 8 oz (76.885 kg)  10/12/14 170 lb (77.111 kg)  07/20/14 167 lb 12 oz (76.091 kg)   Lab Results  Component Value Date   NA 138 07/20/2015   K 4.1 07/20/2015   CL 102 07/20/2015   CO2 29 07/20/2015   Lab Results  Component Value Date   WBC 6.1 07/20/2015   HGB 15.1* 07/20/2015   HCT 44.8 07/20/2015   MCV 91.2 07/20/2015   PLT 226.0 07/20/2015     HLD-  Has been on Lovastatin 40 mg for years. Has h/o liver cyst but no elevated LFTs.  Lab Results  Component Value Date   CHOL 209* 07/20/2015   HDL 51.10 07/20/2015   LDLCALC 123* 07/13/2014   LDLDIRECT 136.0 07/20/2015   TRIG 213.0* 07/20/2015   CHOLHDL 4 07/20/2015     Hyperthyroidism-  Has endocrinologist. Biopsy neg years ago, told it was thyroiditis. Lab Results  Component Value Date   TSH 2.47 07/20/2015   Patient Active Problem List   Diagnosis Date Noted  . Encounter for routine gynecological examination 07/20/2014  . Routine general medical examination at a health care facility 07/24/2013  . Polyarthralgia 03/18/2013  . Thyrotoxicosis 06/22/2010  . HLD (hyperlipidemia) 06/22/2010  . Essential hypertension 06/22/2010  . DIVERTICULOSIS, COLON 06/22/2010    Social History  Substance Use Topics  . Smoking status: Never Smoker   . Smokeless tobacco: Never Used  . Alcohol Use: Yes   Family History  Problem Relation Age of Onset  . Hyperlipidemia Mother   . Hypertension Mother   . Dementia Father    Allergies  Allergen Reactions  . Meperidine  Hcl     REACTION: vomiting  . Sulfamethoxazole-Trimethoprim     REACTION: hives   Current Outpatient Prescriptions on File Prior to Visit  Medication Sig Dispense Refill  . Calcium Carb-Cholecalciferol (CALCIUM 500 +D) 500-400 MG-UNIT TABS Take 1 capsule by mouth at bedtime.      . fluticasone (FLONASE) 50 MCG/ACT nasal spray USE 1 SPRAY INTO THE NOSE DAILY. 16 g 1  . lovastatin (MEVACOR) 40 MG tablet TAKE 1 TABLET (40 MG TOTAL) BY MOUTH AT BEDTIME. 30 tablet 0  . magnesium gluconate (MAGONATE) 500 MG tablet Take 500 mg by mouth daily.    . methimazole (TAPAZOLE) 5 MG tablet Take 5 mg by mouth daily.    Marland Kitchen MILK THISTLE EXTRACT PO Take by mouth. Take 250 mg's by mouth daily    . Multiple Vitamin (MULTIVITAMIN) tablet Take 1 tablet by mouth daily.    . Probiotic Product (PROBIOTIC PO) Take by mouth.     No current facility-administered medications on file prior to visit.   The PMH, PSH, Social History, Family History, Medications, and allergies have been reviewed in [facility], and have been updated if relevant.   Review of Systems  Constitutional: Negative.   HENT: Negative.   Eyes: Negative.   Respiratory: Negative.   Cardiovascular: Negative.   Gastrointestinal: Negative.   Endocrine: Negative.   Genitourinary: Negative.   Musculoskeletal: Negative.  Skin: Negative.   Allergic/Immunologic: Negative.   Neurological: Negative.   Hematological: Negative.   Psychiatric/Behavioral: Negative.   All other systems reviewed and are negative.       Objective:   Physical Exam BP 110/62 mmHg  Pulse 65  Temp(Src) 98.1 F (36.7 C) (Oral)  Ht 4' 11.75" (1.518 m)  Wt 169 lb 8 oz (76.885 kg)  BMI 33.37 kg/m2  SpO2 96%  LMP  (Approximate)  General:  Well-developed,well-nourished,in no acute distress; alert,appropriate and cooperative throughout examination Head:  normocephalic and atraumatic.   Eyes:  vision grossly intact, pupils equal, pupils round, and pupils reactive to light.    Ears:  R ear normal and L ear normal.   Nose:  no external deformity.   Mouth:  good dentition.   Neck:  No deformities, masses, or tenderness noted. Breasts:  No mass, nodules, thickening, tenderness, bulging, retraction, inflamation, nipple discharge or skin changes noted.   Lungs:  Normal respiratory effort, chest expands symmetrically. Lungs are clear to auscultation, no crackles or wheezes. Heart:  Normal rate and regular rhythm. S1 and S2 normal without gallop, murmur, click, rub or other extra sounds. Abdomen:  Bowel sounds positive,abdomen soft and non-tender without masses, organomegaly or hernias noted. Msk:  No deformity or scoliosis noted of thoracic or lumbar spine.   Extremities:  No clubbing, cyanosis, edema, or deformity noted with normal full range of motion of all joints. Neurologic:  alert & oriented X3 and gait normal.   Skin:  Intact without suspicious lesions or rashes Cervical Nodes:  No lymphadenopathy noted Axillary Nodes:  No palpable lymphadenopathy Psych:  Cognition and judgment appear intact. Alert and cooperative with normal attention span and concentration. No apparent delusions, illusions, hallucinations    Assessment & Plan:

## 2015-07-25 NOTE — Assessment & Plan Note (Signed)
Continue current dose of lovastatin.

## 2015-07-25 NOTE — Assessment & Plan Note (Signed)
Normotensive 

## 2015-08-25 ENCOUNTER — Other Ambulatory Visit: Payer: Self-pay | Admitting: Family Medicine

## 2015-08-25 DIAGNOSIS — Z1231 Encounter for screening mammogram for malignant neoplasm of breast: Secondary | ICD-10-CM

## 2015-10-03 ENCOUNTER — Ambulatory Visit
Admission: RE | Admit: 2015-10-03 | Discharge: 2015-10-03 | Disposition: A | Discharge status: Home / Self Care | Payer: Managed Care, Other (non HMO) | Source: Ambulatory Visit | Attending: Family Medicine | Admitting: Family Medicine

## 2015-10-03 ENCOUNTER — Other Ambulatory Visit: Payer: Self-pay | Admitting: Family Medicine

## 2015-10-03 DIAGNOSIS — Z1231 Encounter for screening mammogram for malignant neoplasm of breast: Secondary | ICD-10-CM

## 2015-10-04 ENCOUNTER — Other Ambulatory Visit: Payer: Self-pay | Admitting: Family Medicine

## 2015-10-04 DIAGNOSIS — R928 Other abnormal and inconclusive findings on diagnostic imaging of breast: Secondary | ICD-10-CM

## 2015-10-16 HISTORY — PX: BREAST BIOPSY: SHX20

## 2015-10-19 ENCOUNTER — Ambulatory Visit
Admission: RE | Admit: 2015-10-19 | Discharge: 2015-10-19 | Disposition: A | Discharge status: Home / Self Care | Payer: Managed Care, Other (non HMO) | Source: Ambulatory Visit | Attending: Family Medicine | Admitting: Family Medicine

## 2015-10-19 ENCOUNTER — Other Ambulatory Visit: Payer: Self-pay | Admitting: Family Medicine

## 2015-10-19 DIAGNOSIS — R928 Other abnormal and inconclusive findings on diagnostic imaging of breast: Secondary | ICD-10-CM

## 2015-10-19 DIAGNOSIS — N63 Unspecified lump in unspecified breast: Secondary | ICD-10-CM

## 2015-10-21 ENCOUNTER — Encounter: Payer: Self-pay | Admitting: Family Medicine

## 2015-11-02 ENCOUNTER — Ambulatory Visit
Admission: RE | Admit: 2015-11-02 | Discharge: 2015-11-02 | Disposition: A | Discharge status: Home / Self Care | Payer: Managed Care, Other (non HMO) | Source: Ambulatory Visit | Attending: Family Medicine | Admitting: Family Medicine

## 2015-11-02 DIAGNOSIS — N63 Unspecified lump in unspecified breast: Secondary | ICD-10-CM

## 2015-11-03 LAB — SURGICAL PATHOLOGY

## 2016-03-23 ENCOUNTER — Telehealth: Payer: Self-pay

## 2016-03-23 NOTE — Telephone Encounter (Signed)
Julia Moreno received a letter from [facility] that it was time for her mammogram and she needed to contact her doctor for a referral. Would you get this scheduled for her or let her know what she needs to do.

## 2016-03-27 NOTE — Telephone Encounter (Signed)
Pt aware.

## 2016-03-27 NOTE — Telephone Encounter (Signed)
Spoke with jamie @ [facility] Pt is not due for screening mammogram until 09/2016

## 2016-04-20 ENCOUNTER — Telehealth: Payer: Self-pay

## 2016-04-20 NOTE — Telephone Encounter (Signed)
Pt called to see if needed update on tetanus shot; advised pt had tdap on 07/20/2014 so pt is up to date on tetanus shot. Pt voiced understanding.

## 2016-07-04 ENCOUNTER — Other Ambulatory Visit: Payer: Managed Care, Other (non HMO)

## 2016-07-18 ENCOUNTER — Other Ambulatory Visit: Payer: Self-pay | Admitting: Family Medicine

## 2016-07-18 DIAGNOSIS — Z0000 Encounter for general adult medical examination without abnormal findings: Secondary | ICD-10-CM

## 2016-07-24 ENCOUNTER — Other Ambulatory Visit (INDEPENDENT_AMBULATORY_CARE_PROVIDER_SITE_OTHER): Payer: Managed Care, Other (non HMO)

## 2016-07-24 DIAGNOSIS — R7989 Other specified abnormal findings of blood chemistry: Secondary | ICD-10-CM | POA: Diagnosis not present

## 2016-07-24 DIAGNOSIS — Z0000 Encounter for general adult medical examination without abnormal findings: Secondary | ICD-10-CM | POA: Diagnosis not present

## 2016-07-24 LAB — TSH: TSH: 1.49 u[IU]/mL (ref 0.35–4.50)

## 2016-07-24 LAB — CBC WITH DIFFERENTIAL/PLATELET
BASOS ABS: 0 10*3/uL (ref 0.0–0.1)
Basophils Relative: 0.6 % (ref 0.0–3.0)
EOS ABS: 0.1 10*3/uL (ref 0.0–0.7)
Eosinophils Relative: 2.1 % (ref 0.0–5.0)
HEMATOCRIT: 42.7 % (ref 36.0–46.0)
Hemoglobin: 14.7 g/dL (ref 12.0–15.0)
LYMPHS PCT: 38.9 % (ref 12.0–46.0)
Lymphs Abs: 2.1 10*3/uL (ref 0.7–4.0)
MCHC: 34.4 g/dL (ref 30.0–36.0)
MCV: 90.2 fl (ref 78.0–100.0)
MONOS PCT: 6.2 % (ref 3.0–12.0)
Monocytes Absolute: 0.3 10*3/uL (ref 0.1–1.0)
Neutro Abs: 2.9 10*3/uL (ref 1.4–7.7)
Neutrophils Relative %: 52.2 % (ref 43.0–77.0)
PLATELETS: 223 10*3/uL (ref 150.0–400.0)
RBC: 4.73 Mil/uL (ref 3.87–5.11)
RDW: 12.1 % (ref 11.5–15.5)
WBC: 5.5 10*3/uL (ref 4.0–10.5)

## 2016-07-24 LAB — LIPID PANEL
Cholesterol: 196 mg/dL (ref 0–200)
HDL: 41.4 mg/dL (ref >39.00)
NONHDL: 155.08
Total CHOL/HDL Ratio: 5
Triglycerides: 218 mg/dL — ABNORMAL HIGH (ref 0.0–149.0)
VLDL: 43.6 mg/dL — ABNORMAL HIGH (ref 0.0–40.0)

## 2016-07-24 LAB — COMPREHENSIVE METABOLIC PANEL
ALK PHOS: 71 U/L (ref 39–117)
ALT: 24 U/L (ref 0–35)
AST: 12 U/L (ref 0–37)
Albumin: 3.9 g/dL (ref 3.5–5.2)
BILIRUBIN TOTAL: 0.7 mg/dL (ref 0.2–1.2)
BUN: 19 mg/dL (ref 6–23)
CALCIUM: 9.4 mg/dL (ref 8.4–10.5)
CO2: 28 meq/L (ref 19–32)
CREATININE: 0.81 mg/dL (ref 0.40–1.20)
Chloride: 103 mEq/L (ref 96–112)
GFR: 75.54 mL/min (ref >60.00)
GLUCOSE: 94 mg/dL (ref 70–99)
Potassium: 4 mEq/L (ref 3.5–5.1)
Sodium: 139 mEq/L (ref 135–145)
TOTAL PROTEIN: 6.3 g/dL (ref 6.0–8.3)

## 2016-07-24 LAB — LDL CHOLESTEROL, DIRECT: Direct LDL: 120 mg/dL

## 2016-07-25 ENCOUNTER — Encounter
Admission: RE | Admit: 2016-07-25 | Discharge: 2016-07-25 | Disposition: A | Discharge status: Home / Self Care | Payer: Managed Care, Other (non HMO) | Source: Ambulatory Visit | Attending: Orthopedic Surgery | Admitting: Orthopedic Surgery

## 2016-07-25 DIAGNOSIS — Z01812 Encounter for preprocedural laboratory examination: Secondary | ICD-10-CM | POA: Insufficient documentation

## 2016-07-25 DIAGNOSIS — Z01810 Encounter for preprocedural cardiovascular examination: Secondary | ICD-10-CM | POA: Insufficient documentation

## 2016-07-25 DIAGNOSIS — M1612 Unilateral primary osteoarthritis, left hip: Secondary | ICD-10-CM | POA: Insufficient documentation

## 2016-07-25 HISTORY — DX: Unspecified osteoarthritis, unspecified site: M19.90

## 2016-07-25 LAB — PROTIME-INR
INR: 0.94
Prothrombin Time: 12.6 seconds (ref 11.4–15.2)

## 2016-07-25 LAB — TYPE AND SCREEN
ABO/RH(D): O POS
Antibody Screen: NEGATIVE

## 2016-07-25 LAB — URINALYSIS COMPLETE WITH MICROSCOPIC (ARMC ONLY)
Bilirubin Urine: NEGATIVE
GLUCOSE, UA: NEGATIVE mg/dL
Hgb urine dipstick: NEGATIVE
KETONES UR: NEGATIVE mg/dL
Nitrite: NEGATIVE
PROTEIN: NEGATIVE mg/dL
SPECIFIC GRAVITY, URINE: 1.008 (ref 1.005–1.030)
pH: 7 (ref 5.0–8.0)

## 2016-07-25 LAB — SURGICAL PCR SCREEN
MRSA, PCR: NEGATIVE
Staphylococcus aureus: POSITIVE — AB

## 2016-07-25 LAB — APTT: APTT: 30 s (ref 24–36)

## 2016-07-25 LAB — SEDIMENTATION RATE: Sed Rate: 5 mm/hr (ref 0–30)

## 2016-07-25 NOTE — Patient Instructions (Signed)
  Your procedure is scheduled [id number] 31, 2017 (Tuesday) Report to [facility] Day [facility]nd [facility] To find out your arrival time please call [phone] between 1PM - 3PM on August 13, 2016 (Monday)  Remember: Instructions that are not followed completely may result in serious medical risk, up to and including death, or upon the discretion of your surgeon and anesthesiologist your surgery may need to be rescheduled.    _x___ 1. Do not eat food or drink liquids after midnight. No gum chewing or hard candies.     _x    __ 2. No Alcohol for 24 hours before or after surgery.   _x_    __3. No Smoking for 24 prior to surgery.   ____  4. Bring all medications with you on the day of surgery if instructed.    __x__ 5. Notify your doctor if there is any change in your medical condition     (cold, fever, infections).     Do not wear jewelry, make-up, hairpins, clips or nail polish.  Do not wear lotions, powders, or perfumes. You may wear deodorant.  Do not shave 48 hours prior to surgery. Men may shave face and neck.  Do not bring valuables to the hospital.    [facility] is not responsible for any belongings or valuables.               Contacts, dentures or bridgework may not be worn into surgery.  Leave your suitcase in the car. After surgery it may be brought to your room.  For patients admitted to the hospital, discharge time is determined by your treatment team.   Patients discharged the day of surgery will not be allowed to drive home.    Please read over the following fact sheets that you were given:   [facility] Preparing for Surgery and or MRSA Information   ____ Take these medicines the morning of surgery with A SIP OF WATER:    1.   2.  3.  4.  5.  6.  ____Fleets enema or Magnesium Citrate as directed.   _x___ Use CHG Soap or sage wipes as directed on instruction sheet   ____ Use inhalers on the day of surgery and bring to hospital day of surgery  ____  Stop metformin 2 days prior to surgery    ____ Take 1/2 of usual insulin dose the night before surgery and none on the morning of           surgery.   _x___ Stop aspirin or coumadin, or plavix (Stop Aspirin one week prior to surgery.)  x__ Stop Anti-inflammatories such as Advil, Aleve, Ibuprofen, Motrin, Naproxen,          Naprosyn, Goodies powders or aspirin products. Ok to take Tylenol.   _x__ Stop supplements until after surgery.  (Stop Probiotic and Milk Thistle now)  ____ Bring C-Pap to the hospital.

## 2016-07-26 ENCOUNTER — Other Ambulatory Visit (HOSPITAL_COMMUNITY)
Admission: RE | Admit: 2016-07-26 | Discharge: 2016-07-26 | Disposition: A | Discharge status: Home / Self Care | Payer: Managed Care, Other (non HMO) | Source: Ambulatory Visit | Attending: Family Medicine | Admitting: Family Medicine

## 2016-07-26 ENCOUNTER — Ambulatory Visit (INDEPENDENT_AMBULATORY_CARE_PROVIDER_SITE_OTHER): Payer: Managed Care, Other (non HMO) | Admitting: Family Medicine

## 2016-07-26 ENCOUNTER — Encounter: Payer: Self-pay | Admitting: Family Medicine

## 2016-07-26 VITALS — BP 126/78 | HR 87 | Temp 98.3°F | Ht 59.75 in | Wt 174.2 lb

## 2016-07-26 DIAGNOSIS — Z1211 Encounter for screening for malignant neoplasm of colon: Secondary | ICD-10-CM

## 2016-07-26 DIAGNOSIS — Z1151 Encounter for screening for human papillomavirus (HPV): Secondary | ICD-10-CM | POA: Insufficient documentation

## 2016-07-26 DIAGNOSIS — K5730 Diverticulosis of large intestine without perforation or abscess without bleeding: Secondary | ICD-10-CM

## 2016-07-26 DIAGNOSIS — E0590 Thyrotoxicosis, unspecified without thyrotoxic crisis or storm: Secondary | ICD-10-CM

## 2016-07-26 DIAGNOSIS — E785 Hyperlipidemia, unspecified: Secondary | ICD-10-CM | POA: Diagnosis not present

## 2016-07-26 DIAGNOSIS — Z0000 Encounter for general adult medical examination without abnormal findings: Secondary | ICD-10-CM

## 2016-07-26 DIAGNOSIS — Z01419 Encounter for gynecological examination (general) (routine) without abnormal findings: Secondary | ICD-10-CM

## 2016-07-26 DIAGNOSIS — M2550 Pain in unspecified joint: Secondary | ICD-10-CM | POA: Diagnosis not present

## 2016-07-26 DIAGNOSIS — I10 Essential (primary) hypertension: Secondary | ICD-10-CM

## 2016-07-26 LAB — URINE CULTURE: Special Requests: NORMAL

## 2016-07-26 NOTE — Assessment & Plan Note (Signed)
Normotensive.  No changes made to rxs. 

## 2016-07-26 NOTE — Progress Notes (Signed)
Subjective:    Patient ID: Julia Moreno, female    DOB: 06-19-1951, 65 y.o.   MRN: [id number]  HPI  65 yo pleasant female here for CPX and follow up of chronic medical conditions.   Flu: never Tetanus: 07/20/14 Pap Smear: 07/20/14 No h/o abnormal pap smears or post menopausal bleeding. Mammogram: 11/02/15 Colonoscopy: 03/21/2010 Zostavax 07/20/14  Has hip replacement on 10/31, Dr. Rudene Christians.  Looking forward to it as she has not been able to play as much golf or do as much cardio as she would like.   Wt Readings from Last 3 Encounters:  07/26/16 174 lb 4 oz (79 kg)  07/25/16 173 lb (78.5 kg)  07/25/15 169 lb 8 oz (76.9 kg)   Lab Results  Component Value Date   NA 139 07/24/2016   K 4.0 07/24/2016   CL 103 07/24/2016   CO2 28 07/24/2016   Lab Results  Component Value Date   WBC 5.5 07/24/2016   HGB 14.7 07/24/2016   HCT 42.7 07/24/2016   MCV 90.2 07/24/2016   PLT 223.0 07/24/2016     HLD-   on Lovastatin 40 mg.  Denies myalgias.  Has h/o liver cyst but no elevated LFTs.  Lab Results  Component Value Date   CHOL 196 07/24/2016   HDL 41.40 07/24/2016   LDLCALC 123 (H) 07/13/2014   LDLDIRECT 120.0 07/24/2016   TRIG 218.0 (H) 07/24/2016   CHOLHDL 5 07/24/2016     Hyperthyroidism-  Has endocrinologist. Biopsy neg years ago, told it was thyroiditis. Lab Results  Component Value Date   TSH 1.49 07/24/2016   Patient Active Problem List  Diagnosis  . Thyrotoxicosis  . HLD (hyperlipidemia)  . Essential hypertension  . Diverticulosis of colon  . Polyarthralgia  . Routine general medical examination at a health care facility  . Encounter for routine gynecological examination    Social History  Substance Use Topics  . Smoking status: Never Smoker  . Smokeless tobacco: Never Used  . Alcohol use Yes     Comment: occassional   Family History  Problem Relation Age of Onset  . Hyperlipidemia Mother   . Hypertension Mother   . Dementia Father    Allergies   Allergen Reactions  . Meperidine Hcl     REACTION: vomiting  . Sulfamethoxazole-Trimethoprim     REACTION: hives   Current Outpatient Prescriptions on File Prior to Visit  Medication Sig Dispense Refill  . aspirin EC 81 MG tablet Take 81 mg by mouth daily.    . Calcium Carb-Cholecalciferol (CALCIUM 500 +D) 500-400 MG-UNIT TABS Take 1 capsule by mouth at bedtime.      . fluticasone (FLONASE) 50 MCG/ACT nasal spray USE 1 SPRAY INTO THE NOSE DAILY. 16 g 1  . loratadine (CLARITIN) 10 MG tablet Take 10 mg by mouth daily as needed for allergies.    Marland Kitchen lovastatin (MEVACOR) 40 MG tablet TAKE 1 TABLET (40 MG TOTAL) BY MOUTH AT BEDTIME. 30 tablet 0  . magnesium gluconate (MAGONATE) 500 MG tablet Take 500 mg by mouth daily.    . methimazole (TAPAZOLE) 5 MG tablet Take 5 mg by mouth daily.    Marland Kitchen MILK THISTLE EXTRACT PO Take 1 capsule by mouth daily. Take 250 mg's by mouth daily    . Multiple Vitamin (MULTIVITAMIN) tablet Take 1 tablet by mouth daily.    . Probiotic Product (PROBIOTIC PO) Take 1 capsule by mouth daily.      No current facility-administered medications on file prior  to visit.    The PMH, PSH, Social History, Family History, Medications, and allergies have been reviewed in [facility], and have been updated if relevant.   Review of Systems  Constitutional: Negative.   HENT: Negative.   Eyes: Negative.   Respiratory: Negative.   Cardiovascular: Negative.   Gastrointestinal: Negative.   Endocrine: Negative.   Genitourinary: Negative.   Musculoskeletal: Negative.   Skin: Negative.   Allergic/Immunologic: Negative.   Neurological: Negative.   Hematological: Negative.   Psychiatric/Behavioral: Negative.   All other systems reviewed and are negative.       Objective:   Physical Exam BP 126/78   Pulse 87   Temp 98.3 F (36.8 C) (Oral)   Ht 4' 11.75" (1.518 m)   Wt 174 lb 4 oz (79 kg)   LMP  (Approximate)   SpO2 94%   BMI 34.32 kg/m    General:   Well-developed,well-nourished,in no acute distress; alert,appropriate and cooperative throughout examination Head:  normocephalic and atraumatic.   Eyes:  vision grossly intact, pupils equal, pupils round, and pupils reactive to light.   Ears:  R ear normal and L ear normal.   Nose:  no external deformity.   Mouth:  good dentition.   Neck:  No deformities, masses, or tenderness noted. Breasts:  No mass, nodules, thickening, tenderness, bulging, retraction, inflamation, nipple discharge or skin changes noted. Lungs:  Normal respiratory effort, chest expands symmetrically. Lungs are clear to auscultation, no crackles or wheezes. Heart:  Normal rate and regular rhythm. S1 and S2 normal without gallop, murmur, click, rub or other extra sounds. Abdomen:  Bowel sounds positive,abdomen soft and non-tender without masses, organomegaly or hernias noted. Rectal:  no external abnormalities.   Genitalia:  Pelvic Exam:        External: normal female genitalia without lesions or masses        Vagina: normal without lesions or masses        Cervix: normal without lesions or masses        Adnexa: normal bimanual exam without masses or fullness        Uterus: normal by palpation        Pap smear: performed Msk:  No deformity or scoliosis noted of thoracic or lumbar spine.   Extremities:  No clubbing, cyanosis, edema, or deformity noted with normal full range of motion of all joints.   Neurologic:  alert & oriented X3 and gait normal.   Skin:  Intact without suspicious lesions or rashes Cervical Nodes:  No lymphadenopathy noted Axillary Nodes:  No palpable lymphadenopathy Psych:  Cognition and judgment appear intact. Alert and cooperative with normal attention span and concentration. No apparent delusions, illusions, hallucinations  Assessment & Plan:

## 2016-07-26 NOTE — Addendum Note (Signed)
Addended by: Modena Nunnery on: 07/26/2016 09:55 AM   Modules accepted: Orders

## 2016-07-26 NOTE — Assessment & Plan Note (Signed)
Stool cards ordered.  Pt to pick them up from the lab today.

## 2016-07-26 NOTE — Assessment & Plan Note (Signed)
Well controlled. No changes made to rxs today. 

## 2016-07-26 NOTE — Patient Instructions (Signed)
Great to see you. Good luck with your surgery.  You will do great!  Please stop by the lab to get your stool cards.

## 2016-07-26 NOTE — Progress Notes (Signed)
Pre visit review using our clinic review tool, if applicable. No additional management support is needed unless otherwise documented below in the visit note. 

## 2016-07-26 NOTE — Assessment & Plan Note (Signed)
Followed by endo Euthyroid.

## 2016-07-26 NOTE — Assessment & Plan Note (Signed)
Discussed USPSTF recommendations of cervical cancer screening.  She is aware that interval of 3 years is recommended but pt would prefer to have pap smear done today.  

## 2016-07-27 LAB — CYTOLOGY - PAP

## 2016-08-01 ENCOUNTER — Other Ambulatory Visit: Payer: Managed Care, Other (non HMO)

## 2016-08-14 ENCOUNTER — Encounter
Admission: RE | Disposition: A | Discharge status: Home Health | Payer: Self-pay | Source: Ambulatory Visit | Attending: Orthopedic Surgery

## 2016-08-14 ENCOUNTER — Inpatient Hospital Stay: Payer: Managed Care, Other (non HMO) | Admitting: Anesthesiology

## 2016-08-14 ENCOUNTER — Inpatient Hospital Stay: Payer: Managed Care, Other (non HMO)

## 2016-08-14 ENCOUNTER — Inpatient Hospital Stay
Admission: RE | Admit: 2016-08-14 | Discharge: 2016-08-16 | DRG: 470 | Disposition: A | Discharge status: Home Health | Payer: Managed Care, Other (non HMO) | Source: Ambulatory Visit | Attending: Orthopedic Surgery | Admitting: Orthopedic Surgery

## 2016-08-14 ENCOUNTER — Encounter: Payer: Self-pay | Admitting: *Deleted

## 2016-08-14 DIAGNOSIS — M6281 Muscle weakness (generalized): Secondary | ICD-10-CM

## 2016-08-14 DIAGNOSIS — Z6835 Body mass index (BMI) 35.0-35.9, adult: Secondary | ICD-10-CM

## 2016-08-14 DIAGNOSIS — M1612 Unilateral primary osteoarthritis, left hip: Principal | ICD-10-CM | POA: Diagnosis present

## 2016-08-14 DIAGNOSIS — E0590 Thyrotoxicosis, unspecified without thyrotoxic crisis or storm: Secondary | ICD-10-CM | POA: Diagnosis present

## 2016-08-14 DIAGNOSIS — E669 Obesity, unspecified: Secondary | ICD-10-CM | POA: Diagnosis present

## 2016-08-14 DIAGNOSIS — E785 Hyperlipidemia, unspecified: Secondary | ICD-10-CM | POA: Diagnosis present

## 2016-08-14 DIAGNOSIS — I10 Essential (primary) hypertension: Secondary | ICD-10-CM | POA: Diagnosis present

## 2016-08-14 DIAGNOSIS — M1990 Unspecified osteoarthritis, unspecified site: Secondary | ICD-10-CM | POA: Diagnosis present

## 2016-08-14 DIAGNOSIS — G8918 Other acute postprocedural pain: Secondary | ICD-10-CM

## 2016-08-14 DIAGNOSIS — Z419 Encounter for procedure for purposes other than remedying health state, unspecified: Secondary | ICD-10-CM

## 2016-08-14 HISTORY — PX: TOTAL HIP ARTHROPLASTY: SHX124

## 2016-08-14 LAB — TYPE AND SCREEN
ABO/RH(D): O POS
Antibody Screen: NEGATIVE

## 2016-08-14 LAB — CBC
HEMATOCRIT: 38.3 % (ref 35.0–47.0)
HEMOGLOBIN: 13.5 g/dL (ref 12.0–16.0)
MCH: 31.4 pg (ref 26.0–34.0)
MCHC: 35.3 g/dL (ref 32.0–36.0)
MCV: 89 fL (ref 80.0–100.0)
Platelets: 179 10*3/uL (ref 150–440)
RBC: 4.3 MIL/uL (ref 3.80–5.20)
RDW: 12.4 % (ref 11.5–14.5)
WBC: 10.1 10*3/uL (ref 3.6–11.0)

## 2016-08-14 LAB — CREATININE, SERUM
Creatinine, Ser: 0.67 mg/dL (ref 0.44–1.00)
GFR calc Af Amer: >60 mL/min (ref >60)

## 2016-08-14 SURGERY — ARTHROPLASTY, HIP, TOTAL, ANTERIOR APPROACH
Anesthesia: Spinal | Site: Hip | Laterality: Left | Wound class: Clean

## 2016-08-14 MED ORDER — ACETAMINOPHEN 650 MG RE SUPP: 650.0000 mg | Freq: Four times a day (QID) | RECTAL | Status: DC | PRN

## 2016-08-14 MED ORDER — MAGNESIUM HYDROXIDE 400 MG/5ML PO SUSP
30.0000 mL | Freq: Every day | ORAL | Status: DC | PRN
  Administered 2016-08-15: 30 mL via ORAL
  Filled 2016-08-14: qty 30

## 2016-08-14 MED ORDER — ACETAMINOPHEN 10 MG/ML IV SOLN
INTRAVENOUS | Status: AC
  Filled 2016-08-14: qty 100

## 2016-08-14 MED ORDER — BUPIVACAINE-EPINEPHRINE (PF) 0.25% -1:200000 IJ SOLN
INTRAMUSCULAR | Status: AC
  Filled 2016-08-14: qty 30

## 2016-08-14 MED ORDER — NEOMYCIN-POLYMYXIN B GU 40-200000 IR SOLN
Status: DC | PRN
Start: 2016-08-14 — End: 2016-08-14
  Administered 2016-08-14: 4 mL

## 2016-08-14 MED ORDER — FLUTICASONE PROPIONATE 50 MCG/ACT NA SUSP
1.0000 | Freq: Every day | NASAL | Status: DC | PRN
  Filled 2016-08-14: qty 16

## 2016-08-14 MED ORDER — METOCLOPRAMIDE HCL 5 MG/ML IJ SOLN: 5.0000 mg | Freq: Three times a day (TID) | INTRAMUSCULAR | Status: DC | PRN

## 2016-08-14 MED ORDER — TRANEXAMIC ACID 1000 MG/10ML IV SOLN
INTRAVENOUS | Status: AC
  Filled 2016-08-14: qty 10

## 2016-08-14 MED ORDER — SODIUM CHLORIDE 0.9 % IV SOLN
INTRAVENOUS | Status: DC | PRN
  Administered 2016-08-14: 20 ug/min via INTRAVENOUS

## 2016-08-14 MED ORDER — BUPIVACAINE-EPINEPHRINE 0.25% -1:200000 IJ SOLN
INTRAMUSCULAR | Status: DC | PRN
  Administered 2016-08-14: 30 mL

## 2016-08-14 MED ORDER — ADULT MULTIVITAMIN W/MINERALS CH
1.0000 | ORAL_TABLET | Freq: Every day | ORAL | Status: DC
  Administered 2016-08-15: 1 via ORAL
  Filled 2016-08-14 (×3): qty 1

## 2016-08-14 MED ORDER — ONDANSETRON HCL 4 MG/2ML IJ SOLN: 4.0000 mg | Freq: Four times a day (QID) | INTRAMUSCULAR | Status: DC | PRN

## 2016-08-14 MED ORDER — METHOCARBAMOL 1000 MG/10ML IJ SOLN
500.0000 mg | Freq: Four times a day (QID) | INTRAMUSCULAR | Status: DC | PRN
  Filled 2016-08-14: qty 5

## 2016-08-14 MED ORDER — PROPOFOL 500 MG/50ML IV EMUL
INTRAVENOUS | Status: DC | PRN
  Administered 2016-08-14: 55 ug/kg/min via INTRAVENOUS

## 2016-08-14 MED ORDER — ONDANSETRON HCL 4 MG/2ML IJ SOLN: 4.0000 mg | Freq: Once | INTRAMUSCULAR | Status: DC | PRN

## 2016-08-14 MED ORDER — LORATADINE 10 MG PO TABS
10.0000 mg | ORAL_TABLET | Freq: Every day | ORAL | Status: DC | PRN
Start: 2016-08-14 — End: 2016-08-16
  Filled 2016-08-14: qty 1

## 2016-08-14 MED ORDER — BISACODYL 10 MG RE SUPP: 10.0000 mg | Freq: Every day | RECTAL | Status: DC | PRN

## 2016-08-14 MED ORDER — MAGNESIUM OXIDE 400 (241.3 MG) MG PO TABS
400.0000 mg | ORAL_TABLET | Freq: Every day | ORAL | Status: DC
  Administered 2016-08-15 – 2016-08-16 (×2): 400 mg via ORAL
  Filled 2016-08-14 (×3): qty 1

## 2016-08-14 MED ORDER — PHENOL 1.4 % MT LIQD
1.0000 | OROMUCOSAL | Status: DC | PRN
  Filled 2016-08-14: qty 177

## 2016-08-14 MED ORDER — ONDANSETRON HCL 4 MG/2ML IJ SOLN
INTRAMUSCULAR | Status: DC | PRN
  Administered 2016-08-14: 4 mg via INTRAVENOUS

## 2016-08-14 MED ORDER — CEFAZOLIN SODIUM-DEXTROSE 2-4 GM/100ML-% IV SOLN
2.0000 g | Freq: Once | INTRAVENOUS | Status: AC
  Administered 2016-08-14: 2 g via INTRAVENOUS

## 2016-08-14 MED ORDER — ONDANSETRON HCL 4 MG PO TABS: 4.0000 mg | ORAL_TABLET | Freq: Four times a day (QID) | ORAL | Status: DC | PRN

## 2016-08-14 MED ORDER — FENTANYL CITRATE (PF) 100 MCG/2ML IJ SOLN
INTRAMUSCULAR | Status: DC | PRN
  Administered 2016-08-14: 25 ug via INTRAVENOUS
  Administered 2016-08-14: 50 ug via INTRAVENOUS
  Administered 2016-08-14: 25 ug via INTRAVENOUS

## 2016-08-14 MED ORDER — ENOXAPARIN SODIUM 40 MG/0.4ML ~~LOC~~ SOLN
40.0000 mg | SUBCUTANEOUS | Status: DC
  Administered 2016-08-15 – 2016-08-16 (×2): 40 mg via SUBCUTANEOUS
  Filled 2016-08-14 (×3): qty 0.4

## 2016-08-14 MED ORDER — MORPHINE SULFATE (PF) 2 MG/ML IV SOLN: 2.0000 mg | INTRAVENOUS | Status: DC | PRN

## 2016-08-14 MED ORDER — CALCIUM CARBONATE-VITAMIN D 500-200 MG-UNIT PO TABS
1.0000 | ORAL_TABLET | Freq: Every day | ORAL | Status: DC
  Administered 2016-08-14: 1 via ORAL
  Filled 2016-08-14: qty 1

## 2016-08-14 MED ORDER — PRAVASTATIN SODIUM 40 MG PO TABS
40.0000 mg | ORAL_TABLET | Freq: Every day | ORAL | Status: DC
  Administered 2016-08-14 – 2016-08-15 (×2): 40 mg via ORAL
  Filled 2016-08-14 (×2): qty 1

## 2016-08-14 MED ORDER — FENTANYL CITRATE (PF) 100 MCG/2ML IJ SOLN: 25.0000 ug | INTRAMUSCULAR | Status: DC | PRN

## 2016-08-14 MED ORDER — ACETAMINOPHEN 10 MG/ML IV SOLN
INTRAVENOUS | Status: DC | PRN
  Administered 2016-08-14: 1000 mg via INTRAVENOUS

## 2016-08-14 MED ORDER — RISAQUAD PO CAPS
1.0000 | ORAL_CAPSULE | Freq: Every day | ORAL | Status: DC
  Administered 2016-08-15: 1 via ORAL
  Filled 2016-08-14 (×3): qty 1

## 2016-08-14 MED ORDER — CEFAZOLIN SODIUM-DEXTROSE 2-4 GM/100ML-% IV SOLN
INTRAVENOUS | Status: AC
  Filled 2016-08-14: qty 100

## 2016-08-14 MED ORDER — ALUM & MAG HYDROXIDE-SIMETH 200-200-20 MG/5ML PO SUSP: 30.0000 mL | ORAL | Status: DC | PRN

## 2016-08-14 MED ORDER — CEFAZOLIN IN D5W 1 GM/50ML IV SOLN
1.0000 g | Freq: Four times a day (QID) | INTRAVENOUS | Status: AC
  Administered 2016-08-14 – 2016-08-15 (×3): 1 g via INTRAVENOUS
  Filled 2016-08-14 (×3): qty 50

## 2016-08-14 MED ORDER — NEOMYCIN-POLYMYXIN B GU 40-200000 IR SOLN
Status: AC
  Filled 2016-08-14: qty 4

## 2016-08-14 MED ORDER — SODIUM CHLORIDE 0.9 % IV SOLN
INTRAVENOUS | Status: DC
  Administered 2016-08-14 (×2): via INTRAVENOUS

## 2016-08-14 MED ORDER — METHIMAZOLE 5 MG PO TABS
5.0000 mg | ORAL_TABLET | Freq: Every day | ORAL | Status: DC
  Administered 2016-08-15: 5 mg via ORAL
  Filled 2016-08-14 (×4): qty 1

## 2016-08-14 MED ORDER — MIDAZOLAM HCL 5 MG/5ML IJ SOLN
INTRAMUSCULAR | Status: DC | PRN
  Administered 2016-08-14: 2 mg via INTRAVENOUS

## 2016-08-14 MED ORDER — EPHEDRINE SULFATE 50 MG/ML IJ SOLN
INTRAMUSCULAR | Status: DC | PRN
  Administered 2016-08-14: 10 mg via INTRAVENOUS

## 2016-08-14 MED ORDER — MAGNESIUM CITRATE PO SOLN
1.0000 | Freq: Once | ORAL | Status: DC | PRN
  Filled 2016-08-14: qty 296

## 2016-08-14 MED ORDER — METOCLOPRAMIDE HCL 10 MG PO TABS: 5.0000 mg | ORAL_TABLET | Freq: Three times a day (TID) | ORAL | Status: DC | PRN

## 2016-08-14 MED ORDER — DIPHENHYDRAMINE HCL 12.5 MG/5ML PO ELIX
12.5000 mg | ORAL_SOLUTION | ORAL | Status: DC | PRN
  Administered 2016-08-15: 25 mg via ORAL
  Filled 2016-08-14: qty 10

## 2016-08-14 MED ORDER — MENTHOL 3 MG MT LOZG
1.0000 | LOZENGE | OROMUCOSAL | Status: DC | PRN
  Filled 2016-08-14: qty 9

## 2016-08-14 MED ORDER — ASPIRIN EC 81 MG PO TBEC
81.0000 mg | DELAYED_RELEASE_TABLET | Freq: Every day | ORAL | Status: DC
  Administered 2016-08-14 – 2016-08-15 (×2): 81 mg via ORAL
  Filled 2016-08-14 (×3): qty 1

## 2016-08-14 MED ORDER — FENTANYL CITRATE (PF) 100 MCG/2ML IJ SOLN
25.0000 ug | INTRAMUSCULAR | Status: DC | PRN
  Administered 2016-08-14 (×3): 25 ug via INTRAVENOUS

## 2016-08-14 MED ORDER — LACTATED RINGERS IV SOLN
INTRAVENOUS | Status: DC
  Administered 2016-08-14: 07:00:00 via INTRAVENOUS

## 2016-08-14 MED ORDER — METHOCARBAMOL 500 MG PO TABS
500.0000 mg | ORAL_TABLET | Freq: Four times a day (QID) | ORAL | Status: DC | PRN
  Administered 2016-08-14 – 2016-08-16 (×3): 500 mg via ORAL
  Filled 2016-08-14 (×3): qty 1

## 2016-08-14 MED ORDER — ZOLPIDEM TARTRATE 5 MG PO TABS: 5.0000 mg | ORAL_TABLET | Freq: Every evening | ORAL | Status: DC | PRN

## 2016-08-14 MED ORDER — FENTANYL CITRATE (PF) 100 MCG/2ML IJ SOLN
INTRAMUSCULAR | Status: AC
  Filled 2016-08-14: qty 2

## 2016-08-14 MED ORDER — FAMOTIDINE 20 MG PO TABS
20.0000 mg | ORAL_TABLET | Freq: Once | ORAL | Status: AC
  Administered 2016-08-14: 20 mg via ORAL

## 2016-08-14 MED ORDER — DOCUSATE SODIUM 100 MG PO CAPS
100.0000 mg | ORAL_CAPSULE | Freq: Two times a day (BID) | ORAL | Status: DC
  Administered 2016-08-14 – 2016-08-16 (×5): 100 mg via ORAL
  Filled 2016-08-14 (×6): qty 1

## 2016-08-14 MED ORDER — PROPOFOL 10 MG/ML IV BOLUS
INTRAVENOUS | Status: DC | PRN
  Administered 2016-08-14 (×3): 20 mg via INTRAVENOUS

## 2016-08-14 MED ORDER — TRANEXAMIC ACID 1000 MG/10ML IV SOLN
INTRAVENOUS | Status: DC | PRN
  Administered 2016-08-14: 1000 mg via INTRAVENOUS

## 2016-08-14 MED ORDER — FAMOTIDINE 20 MG PO TABS
ORAL_TABLET | ORAL | Status: AC
  Administered 2016-08-14: 20 mg via ORAL
  Filled 2016-08-14: qty 1

## 2016-08-14 MED ORDER — PHENYLEPHRINE HCL 10 MG/ML IJ SOLN
INTRAMUSCULAR | Status: DC | PRN
  Administered 2016-08-14 (×2): 150 ug via INTRAVENOUS
  Administered 2016-08-14 (×2): 100 ug via INTRAVENOUS

## 2016-08-14 MED ORDER — ACETAMINOPHEN 325 MG PO TABS: 650.0000 mg | ORAL_TABLET | Freq: Four times a day (QID) | ORAL | Status: DC | PRN

## 2016-08-14 MED ORDER — OXYCODONE HCL 5 MG PO TABS
5.0000 mg | ORAL_TABLET | ORAL | Status: DC | PRN
  Administered 2016-08-14 (×2): 10 mg via ORAL
  Administered 2016-08-14: 5 mg via ORAL
  Administered 2016-08-15 (×2): 10 mg via ORAL
  Administered 2016-08-15: 5 mg via ORAL
  Filled 2016-08-14 (×2): qty 2
  Filled 2016-08-14: qty 1
  Filled 2016-08-14 (×3): qty 2

## 2016-08-14 SURGICAL SUPPLY — 47 items
BLADE SAW SAG 18.5X105 (BLADE) ×2 IMPLANT
BNDG COHESIVE 6X5 TAN STRL LF (GAUZE/BANDAGES/DRESSINGS) ×4 IMPLANT
CANISTER SUCT 1200ML W/VALVE (MISCELLANEOUS) ×2 IMPLANT
CAPT HIP TOTAL 3 ×2 IMPLANT
CATH FOL LEG HOLDER (MISCELLANEOUS) ×2 IMPLANT
CATH TRAY METER 16FR LF (MISCELLANEOUS) ×2 IMPLANT
CHLORAPREP W/TINT 26ML (MISCELLANEOUS) ×2 IMPLANT
DRAPE C-ARM XRAY 36X54 (DRAPES) ×2 IMPLANT
DRAPE INCISE IOBAN 66X60 STRL (DRAPES) IMPLANT
DRAPE POUCH INSTRU U-SHP 10X18 (DRAPES) ×2 IMPLANT
DRAPE SHEET LG 3/4 BI-LAMINATE (DRAPES) ×6 IMPLANT
DRAPE STERI IOBAN 125X83 (DRAPES) ×2 IMPLANT
DRAPE TABLE BACK 80X90 (DRAPES) ×2 IMPLANT
DRSG OPSITE POSTOP 4X8 (GAUZE/BANDAGES/DRESSINGS) ×4 IMPLANT
ELECT BLADE 6.5 EXT (BLADE) ×2 IMPLANT
GAUZE SPONGE 4X4 12PLY STRL (GAUZE/BANDAGES/DRESSINGS) IMPLANT
GLOVE BIO SURGEON STRL SZ7.5 (GLOVE) ×8 IMPLANT
GLOVE BIOGEL PI IND STRL 9 (GLOVE) ×1 IMPLANT
GLOVE BIOGEL PI INDICATOR 9 (GLOVE) ×1
GLOVE SURG SYN 9.0  PF PI (GLOVE) ×2
GLOVE SURG SYN 9.0 PF PI (GLOVE) ×2 IMPLANT
GOWN SRG 2XL LVL 4 RGLN SLV (GOWNS) ×1 IMPLANT
GOWN STRL NON-REIN 2XL LVL4 (GOWNS) ×1
GOWN STRL REUS W/ TWL LRG LVL3 (GOWN DISPOSABLE) ×2 IMPLANT
GOWN STRL REUS W/TWL LRG LVL3 (GOWN DISPOSABLE) ×2
HEMOVAC 400CC 10FR (MISCELLANEOUS) ×2 IMPLANT
HOOD PEEL AWAY FLYTE STAYCOOL (MISCELLANEOUS) ×2 IMPLANT
MAT BLUE FLOOR 46X72 FLO (MISCELLANEOUS) ×2 IMPLANT
NDL SAFETY 18GX1.5 (NEEDLE) ×2 IMPLANT
NEEDLE SPNL 18GX3.5 QUINCKE PK (NEEDLE) ×2 IMPLANT
NS IRRIG 1000ML POUR BTL (IV SOLUTION) ×2 IMPLANT
PACK HIP COMPR (MISCELLANEOUS) ×2 IMPLANT
SOL PREP PVP 2OZ (MISCELLANEOUS) ×2
SOLUTION PREP PVP 2OZ (MISCELLANEOUS) ×1 IMPLANT
STAPLER SKIN PROX 35W (STAPLE) ×2 IMPLANT
STRAP SAFETY BODY (MISCELLANEOUS) ×2 IMPLANT
SUT DVC 2 QUILL PDO  T11 36X36 (SUTURE) ×1
SUT DVC 2 QUILL PDO T11 36X36 (SUTURE) ×1 IMPLANT
SUT DVC QUILL MONODERM 30X30 (SUTURE) ×2 IMPLANT
SUT SILK 0 (SUTURE) ×1
SUT SILK 0 30XBRD TIE 6 (SUTURE) ×1 IMPLANT
SUT VIC AB 1 CT1 36 (SUTURE) ×2 IMPLANT
SYR 20CC LL (SYRINGE) ×2 IMPLANT
SYR 30ML LL (SYRINGE) ×2 IMPLANT
TAPE MICROFOAM 4IN (TAPE) IMPLANT
TOWEL OR 17X26 4PK STRL BLUE (TOWEL DISPOSABLE) ×2 IMPLANT
TUBE KAMVAC SUCTION (TUBING) ×2 IMPLANT

## 2016-08-14 NOTE — Evaluation (Signed)
Physical Therapy Evaluation Patient Details Name: Julia Moreno MRN: [id number] DOB: Jan 15, 1951 Today's Date: 08/14/2016   History of Present Illness  Pt underwent L THR anterior approach without reported post-op complications. No reported falls in the last 12 months.  Clinical Impression  Pt admitted with above diagnosis. Pt currently with functional limitations due to the deficits listed below (see PT Problem List). Pt demonstrates excellent mobility for POD#0 requiring CGA only for bed mobility, transfers, and ambulation. She is able to ambulate a short distance from bed to recliner and completes all supine exercises as instructed. Pt demonstrates good LLE strength and is able to perform SLR and SAQ without assistance. Pt will be appropriate to return home at DC with [facility] PT and family support. Pt will benefit from skilled PT services to address deficits in strength, balance, and mobility in order to return to full function at home.     Follow Up Recommendations Home health PT    Equipment Recommendations  Rolling walker with 5" wheels;Other (comment) (Husband is going to check to see if their walker has wheels)    Recommendations for Other Services       Precautions / Restrictions Precautions Precautions: Anterior Hip Precaution Booklet Issued: Yes (comment) Restrictions Weight Bearing Restrictions: No      Mobility  Bed Mobility Overal bed mobility: Needs Assistance Bed Mobility: Supine to Sit     Supine to sit: Min guard     General bed mobility comments: Cues for proper sequencing. HOB elevated and use of bed rails  Transfers Overall transfer level: Needs assistance Equipment used: Rolling walker (2 wheeled) Transfers: Sit to/from Stand Sit to Stand: Min guard         General transfer comment: Cues for safe hand placement. Initially with decreased weight shift to LLE but good weight shift once upright in standing. Slightly decreased speed but fair for  POD#0  Ambulation/Gait Ambulation/Gait assistance: Min guard Ambulation Distance (Feet): 3 Feet Assistive device: Rolling walker (2 wheeled) Gait Pattern/deviations: Step-to pattern Gait velocity: Decreased Gait velocity interpretation: <1.8 ft/sec, indicative of risk for recurrent falls General Gait Details: Pt able to ambulate from bed to recliner with step-to pattern. Good weight shifting noted to LLE and no buckling  Stairs            Wheelchair Mobility    Modified Rankin (Stroke Patients Only)       Balance Overall balance assessment: Needs assistance Sitting-balance support: No upper extremity supported Sitting balance-Leahy Scale: Good     Standing balance support: Bilateral upper extremity supported Standing balance-Leahy Scale: Fair                               Pertinent Vitals/Pain Pain Assessment: 0-10 Pain Score: 5  Pain Location: L hip Pain Descriptors / Indicators: Operative site guarding Pain Intervention(s): Monitored during session;Premedicated before session    Home Living Family/patient expects to be discharged to:: Private residence Living Arrangements: Spouse/significant other Available Help at Discharge: Family Type of Home: House Home Access: Stairs to enter Entrance Stairs-Rails: Psychiatric nurse of Steps: 3 Home Layout: One level Home Equipment: Walker - standard;Cane - single point;Bedside commode;Shower seat - built in;Other (comment) (Unsure if walker is standard or with wheels)      Prior Function Level of Independence: Independent         Comments: Independent with ADLs/IADLs. Drives, plays golf     Hand Dominance   Dominant  Hand: Right    Extremity/Trunk Assessment   Upper Extremity Assessment: Overall WFL for tasks assessed           Lower Extremity Assessment: LLE deficits/detail   LLE Deficits / Details: RLE grossly WFL. LLE: able to perform SLR and SAQ without assist. Full  DF/PF. Sensation intact throughout LLE     Communication   Communication: No difficulties  Cognition Arousal/Alertness: Awake/alert Behavior During Therapy: WFL for tasks assessed/performed Overall Cognitive Status: Within Functional Limits for tasks assessed                      General Comments      Exercises Total Joint Exercises Ankle Circles/Pumps: Strengthening;Both;10 reps;Supine Quad Sets: Strengthening;Both;10 reps;Supine Gluteal Sets: Strengthening;Both;10 reps;Supine Towel Squeeze: Strengthening;Both;10 reps;Supine Short Arc Quad: Strengthening;Left;10 reps;Supine Heel Slides: Strengthening;Left;10 reps;Supine Hip ABduction/ADduction: Strengthening;Left;10 reps;Supine Straight Leg Raises: Strengthening;Left;10 reps;Supine   Assessment/Plan    PT Assessment Patient needs continued PT services  PT Problem List Decreased strength;Decreased range of motion;Decreased activity tolerance;Decreased balance;Decreased mobility;Decreased knowledge of use of DME;Pain          PT Treatment Interventions DME instruction;Gait training;Stair training;Therapeutic exercise;Balance training;Neuromuscular re-education;Patient/family education    PT Goals (Current goals can be found in the Care Plan section)  Acute Rehab PT Goals Patient Stated Goal: Return to prior level of function and play golf PT Goal Formulation: With patient/family Time For Goal Achievement: 08/28/16 Potential to Achieve Goals: Good    Frequency BID   Barriers to discharge        Co-evaluation               End of Session Equipment Utilized During Treatment: Gait belt Activity Tolerance: Patient tolerated treatment well Patient left: in chair;with call bell/phone within reach;with chair alarm set;with family/visitor present;with SCD's reapplied;Other (comment) (Pillow unde LLE, ice pack on hip) Nurse Communication: Mobility status         Time: [id number] PT Time Calculation (min)  (ACUTE ONLY): 35 min   Charges:   PT Evaluation $PT Eval Low Complexity: 1 Procedure PT Treatments $Therapeutic Exercise: 8-22 mins   PT G Codes:       Lyndel Safe Huprich PT, DPT   Huprich,Jason 08/14/2016, 4:07 PM

## 2016-08-14 NOTE — Transfer of Care (Signed)
Immediate Anesthesia Transfer of Care Note  Patient: Julia Moreno  Procedure(s) Performed: Procedure(s): TOTAL HIP ARTHROPLASTY ANTERIOR APPROACH (Left)  Patient Location: PACU  Anesthesia Type:Spinal  Level of Consciousness: awake, alert  and oriented  Airway & Oxygen Therapy: Patient Spontanous Breathing and Patient connected to face mask oxygen  Post-op Assessment: Report given to RN and Post -op Vital signs reviewed and stable  Post vital signs: Reviewed and stable  Last Vitals:  Vitals:   08/14/16 0615  BP: (!) 155/90  Pulse: 77  Resp: 12  Temp: 36.5 C    Last Pain:  Vitals:   08/14/16 0615  TempSrc: Tympanic         Complications: No apparent anesthesia complications

## 2016-08-14 NOTE — H&P (Signed)
Reviewed paper H+P, will be scanned into chart. No changes noted.  

## 2016-08-14 NOTE — Anesthesia Procedure Notes (Signed)
Spinal  Patient location during procedure: OR Staffing Anesthesiologist: Gunnar Bulla Performed: anesthesiologist  Preanesthetic Checklist Completed: patient identified, site marked, surgical consent, pre-op evaluation, timeout performed, IV checked and risks and benefits discussed Spinal Block Patient position: sitting Prep: Betadine Patient monitoring: heart rate, cardiac monitor, continuous pulse ox and blood pressure Approach: midline Location: L3-4 Injection technique: single-shot Needle Needle type: Pencil-Tip  Needle gauge: 25 G Needle length: 9 cm Assessment Sensory level: T10 Additional Notes Marcaine 1.61ml.

## 2016-08-14 NOTE — Op Note (Signed)
08/14/2016  9:13 AM  PATIENT:  Julia Moreno  65 y.o. female  PRE-OPERATIVE DIAGNOSIS:  Primary Osteoarthritis left hip  POST-OPERATIVE DIAGNOSIS:  Primary Osteoarthritis left hip  PROCEDURE:  Procedure(s): TOTAL HIP ARTHROPLASTY ANTERIOR APPROACH (Left)  SURGEON: Laurene Footman, MD  ASSISTANTS: None  ANESTHESIA:   spinal  EBL:  Total I/O In: 800 [I.V.:800] Out: 1100 [Urine:700; Blood:400]  BLOOD ADMINISTERED:none  DRAINS: none   LOCAL MEDICATIONS USED:  MARCAINE    and OTHER TXA  SPECIMEN:  Source of Specimen:  Left femoral head  DISPOSITION OF SPECIMEN:  PATHOLOGY  COUNTS:  YES  TOURNIQUET:  * No tourniquets in log *  IMPLANTS: Medacta AMIS 1 standard stem, s 22.2 head, 46 mm Mpact DM cup and liner  DICTATION: .Dragon Dictation   The patient was brought to the operating room and after spinal anesthesia was obtained patient yet we was placed on the operative table with the ipsilateral foot into the Medacta attachment, contralateral leg on a well-padded table. C-arm was brought in and preop template x-ray taken. After prepping and draping in usual sterile fashion appropriate patient identification and timeout procedures were completed. Anterior approach to the hip was obtained and centered over the greater trochanter and TFL muscle. The subcutaneous tissue was incised hemostasis being achieved by electrocautery. TFL fascia was incised and the muscle retracted laterally deep retractor placed. The lateral femoral circumflex vessels were identified and ligated. The anterior capsule was exposed and a capsulotomy performed. The neck was identified and a femoral neck cut carried out with a saw. The head was removed without difficulty and showed sclerotic femoral head and acetabulum. Reaming was carried out to 46 mm and a 46 mm cup trial gave appropriate tightness to the acetabular component a 46 DM cup was impacted into position. The leg was then externally rotated and  ischiofemoral and pubofemoral releases carried out. The femur was sequentially broached to a size 1, size 1 standard width S head trials were placed and the final components chosen. The 1 standard stem was inserted along with a S 22.2 mm head and 46 mm liner. The hip was reduced and was stable the wound was thoroughly irrigated. The deep fascia was closed using a heavy Quill after infiltration of 30 cc of quarter percent Sensorcaine with epinephrine, and TXA and injected into the joint.Marland Kitchen 2-0 Quill to close the skin with skin staples Xeroform and honeycomb dressing applied  PLAN OF CARE: Admit to inpatient

## 2016-08-14 NOTE — NC FL2 (Signed)
[place] LEVEL OF CARE SCREENING TOOL     IDENTIFICATION  Patient Name: Julia Moreno Birthdate: 03-Sep-1951 Sex: female Admission Date (Current Location): 08/14/2016  [place] and [place] Number:  Engineering geologist and Address:  [facility], [address] [place], [place] [address]      Provider Number: [id number]  Attending Physician Name and Address:  Hessie Knows, MD  Relative Name and Phone Number:       Current Level of Care: Hospital Recommended Level of Care: [place] Prior Approval Number:    Date Approved/Denied:   PASRR Number:   [id number]  Discharge Plan: SNF    Current Diagnoses: Patient Active Problem List   Diagnosis Date Noted  . Primary osteoarthritis of left hip 08/14/2016  . Encounter for routine gynecological examination 07/20/2014  . Routine general medical examination at a health care facility 07/24/2013  . Polyarthralgia 03/18/2013  . Thyrotoxicosis 06/22/2010  . HLD (hyperlipidemia) 06/22/2010  . Essential hypertension 06/22/2010  . Diverticulosis of colon 06/22/2010    Orientation RESPIRATION BLADDER Height & Weight     Self, Time, Situation, Place  Normal External catheter Weight: 174 lb (78.9 kg) Height:  4\' 11"  (149.9 cm)  BEHAVIORAL SYMPTOMS/MOOD NEUROLOGICAL BOWEL NUTRITION STATUS   (None)  (None.) Continent Diet (Diet: Regular)  AMBULATORY STATUS COMMUNICATION OF NEEDS Skin   Extensive Assist Verbally Surgical wounds (Incision: Left Hip)                       Personal Care Assistance Level of Assistance  Bathing, Feeding, Dressing Bathing Assistance: Limited assistance Feeding assistance: Independent Dressing Assistance: Limited assistance     Functional Limitations Info  Sight, Hearing, Speech Sight Info: Adequate Hearing Info: Adequate Speech Info: Adequate    SPECIAL CARE FACTORS FREQUENCY  PT (By licensed PT), OT (By licensed OT)     PT  Frequency:  (5) OT Frequency:  (5)            Contractures      Additional Factors Info  Code Status, Allergies Code Status Info:  (Full Code) Allergies Info:  (Meperidine Hcl, Sulfamethoxazole-trimethoprim)           Current Medications (08/14/2016):  This is the current hospital active medication list Current Facility-Administered Medications  Medication Dose Route Frequency Provider Last Rate Last Dose  . 0.9 %  sodium chloride infusion   Intravenous Continuous Hessie Knows, MD 75 mL/hr at 08/14/16 1115    . acetaminophen (TYLENOL) tablet 650 mg  650 mg Oral Q6H PRN Hessie Knows, MD       Or  . acetaminophen (TYLENOL) suppository 650 mg  650 mg Rectal Q6H PRN Hessie Knows, MD      . acidophilus (RISAQUAD) capsule 1 capsule  1 capsule Oral Daily Hessie Knows, MD      . alum & mag hydroxide-simeth (MAALOX/MYLANTA) 200-200-20 MG/5ML suspension 30 mL  30 mL Oral Q4H PRN Hessie Knows, MD      . aspirin EC tablet 81 mg  81 mg Oral Daily Hessie Knows, MD   81 mg at 08/14/16 1115  . bisacodyl (DULCOLAX) suppository 10 mg  10 mg Rectal Daily PRN Hessie Knows, MD      . calcium-vitamin D (OSCAL WITH D) 500-200 MG-UNIT per tablet 1 tablet  1 tablet Oral QHS Hessie Knows, MD      . ceFAZolin (ANCEF) IVPB 1 g/50 mL premix  1 g Intravenous Q6H Hessie Knows,  MD   1 g at 08/14/16 1327  . diphenhydrAMINE (BENADRYL) 12.5 MG/5ML elixir 12.5-25 mg  12.5-25 mg Oral Q4H PRN Hessie Knows, MD      . docusate sodium (COLACE) capsule 100 mg  100 mg Oral BID Hessie Knows, MD   100 mg at 08/14/16 1115  . [START ON 08/15/2016] enoxaparin (LOVENOX) injection 40 mg  40 mg Subcutaneous Q24H Hessie Knows, MD      . fentaNYL (SUBLIMAZE) 100 MCG/2ML injection           . fluticasone (FLONASE) 50 MCG/ACT nasal spray 1 spray  1 spray Each Nare Daily PRN Hessie Knows, MD      . loratadine (CLARITIN) tablet 10 mg  10 mg Oral Daily PRN Hessie Knows, MD      . magnesium citrate solution 1 Bottle  1 Bottle Oral Once  PRN Hessie Knows, MD      . magnesium hydroxide (MILK OF MAGNESIA) suspension 30 mL  30 mL Oral Daily PRN Hessie Knows, MD      . magnesium oxide (MAG-OX) tablet 400 mg  400 mg Oral Daily Hessie Knows, MD      . menthol-cetylpyridinium (CEPACOL) lozenge 3 mg  1 lozenge Oral PRN Hessie Knows, MD       Or  . phenol (CHLORASEPTIC) mouth spray 1 spray  1 spray Mouth/Throat PRN Hessie Knows, MD      . methimazole (TAPAZOLE) tablet 5 mg  5 mg Oral Daily Hessie Knows, MD      . methocarbamol (ROBAXIN) tablet 500 mg  500 mg Oral Q6H PRN Hessie Knows, MD   500 mg at 08/14/16 1418   Or  . methocarbamol (ROBAXIN) 500 mg in dextrose 5 % 50 mL IVPB  500 mg Intravenous Q6H PRN Hessie Knows, MD      . metoCLOPramide (REGLAN) tablet 5-10 mg  5-10 mg Oral Q8H PRN Hessie Knows, MD       Or  . metoCLOPramide (REGLAN) injection 5-10 mg  5-10 mg Intravenous Q8H PRN Hessie Knows, MD      . morphine 2 MG/ML injection 2 mg  2 mg Intravenous Q1H PRN Hessie Knows, MD      . multivitamin with minerals tablet 1 tablet  1 tablet Oral Daily Hessie Knows, MD      . ondansetron [facility]) tablet 4 mg  4 mg Oral Q6H PRN Hessie Knows, MD       Or  . ondansetron [facility]) injection 4 mg  4 mg Intravenous Q6H PRN Hessie Knows, MD      . oxyCODONE (Oxy IR/ROXICODONE) immediate release tablet 5-10 mg  5-10 mg Oral Q4H PRN Hessie Knows, MD   10 mg at 08/14/16 1113  . pravastatin (PRAVACHOL) tablet 40 mg  40 mg Oral q1800 Hessie Knows, MD      . zolpidem [facility]) tablet 5 mg  5 mg Oral QHS PRN Hessie Knows, MD         Discharge Medications: Please see discharge summary for a list of discharge medications.  Relevant Imaging Results:  Relevant Lab Results:   Additional Information  (SSN: [id number])  Danie Chandler, Student-Social Work

## 2016-08-14 NOTE — Anesthesia Preprocedure Evaluation (Addendum)
Anesthesia Evaluation  Patient identified by MRN, date of birth, ID band Patient awake    Reviewed: Allergy & Precautions, NPO status , Patient's Chart, lab work & pertinent test results, reviewed documented beta blocker date and time   Airway Mallampati: II  TM Distance: >3 FB     Dental  (+) Chipped   Pulmonary           Cardiovascular hypertension,      Neuro/Psych    GI/Hepatic   Endo/Other  Hyperthyroidism   Renal/GU      Musculoskeletal  (+) Arthritis ,   Abdominal   Peds  Hematology   Anesthesia Other Findings Obese. EKG ok.  Reproductive/Obstetrics                            Anesthesia Physical Anesthesia Plan  ASA: III  Anesthesia Plan: Spinal   Post-op Pain Management:    Induction:   Airway Management Planned:   Additional Equipment:   Intra-op Plan:   Post-operative Plan:   Informed Consent: I have reviewed the patients History and Physical, chart, labs and discussed the procedure including the risks, benefits and alternatives for the proposed anesthesia with the patient or authorized representative who has indicated his/her understanding and acceptance.     Plan Discussed with: CRNA  Anesthesia Plan Comments:         Anesthesia Quick Evaluation

## 2016-08-15 ENCOUNTER — Encounter: Payer: Self-pay | Admitting: Orthopedic Surgery

## 2016-08-15 NOTE — Progress Notes (Signed)
Physical Therapy Treatment Patient Details Name: Julia Moreno MRN: [id number] DOB: 04/15/1951 Today's Date: 08/15/2016    History of Present Illness Pt. is a 65 y.o. female who was admitted to [facility] for a Left THR. Anterior Approach.    PT Comments    Pt able to ambulate around nursing loop with RW SBA; pt steady without loss of balance.  PT verbalized and provided visual demonstration of car transfer technique for pt and pt's husband d/t questions regarding car transfer.  Will need to trial stairs tomorrow and pt would benefit from simulating car transfer as well next session.   Follow Up Recommendations  Home health PT     Equipment Recommendations  Rolling walker with 5" wheels (Youth sized walker fit to pt during session)    Recommendations for Other Services       Precautions / Restrictions Precautions Precautions: Anterior Hip;Fall Precaution Booklet Issued: Yes (comment) Restrictions Weight Bearing Restrictions: Yes LLE Weight Bearing: Weight bearing as tolerated    Mobility  Bed Mobility Overal bed mobility: Needs Assistance Bed Mobility: Supine to Sit     Supine to sit: Supervision     General bed mobility comments: increased effort and time to perform (pt logrolling to R side and then sitting up) but no assist required  Transfers Overall transfer level: Needs assistance Equipment used: Rolling walker (2 wheeled) Transfers: Sit to/from Stand Sit to Stand: Supervision  Toilet transfer with RW SBA       General transfer comment: min vc's for hand placement occasionally  Ambulation/Gait Ambulation/Gait assistance: Supervision Ambulation Distance (Feet): 230 Feet Assistive device: Rolling walker (2 wheeled) Gait Pattern/deviations: Step-through pattern Gait velocity: mildly decreased   General Gait Details: mild decreased stance time L LE; occasional vc's required to increase UE support through RW; no buckling noted   Stairs             Wheelchair Mobility    Modified Rankin (Stroke Patients Only)       Balance Overall balance assessment: Needs assistance Sitting-balance support: Bilateral upper extremity supported;Feet supported Sitting balance-Leahy Scale: Normal     Standing balance support: Bilateral upper extremity supported (on RW) Standing balance-Leahy Scale: Good Standing balance comment: pt able to stand at sink washing hands without any support SBA without any loss of balance                    Cognition Arousal/Alertness: Awake/alert Behavior During Therapy: WFL for tasks assessed/performed Overall Cognitive Status: Within Functional Limits for tasks assessed                      Exercises Total Joint Exercises Long Arc Quad: AROM;Strengthening;Both;10 reps;Seated Marching in Standing: Strengthening;Both;10 reps;Seated (Sitting in recliner; AROM R; AAROM L)  Vc's for technique required.    General Comments General comments (skin integrity, edema, etc.): Pt resting in bed upon PT entering room.  Pt agreeable to PT session.  Pt's husband present for session.      Pertinent Vitals/Pain Pain Assessment: 0-10 Pain Score: 5  Pain Location: L anterior hip/thigh Pain Descriptors / Indicators: Aching Pain Intervention(s): Limited activity within patient's tolerance;Monitored during session;Premedicated before session;Repositioned (Pt's husband contacted nursing for more ice for pt's hip)  Vitals (HR and O2) stable and WFL throughout treatment session.     Home Living                      Prior Function  PT Goals (current goals can now be found in the care plan section) Acute Rehab PT Goals Patient Stated Goal: to return home PT Goal Formulation: With patient/family Time For Goal Achievement: 08/28/16 Potential to Achieve Goals: Good Progress towards PT goals: Progressing toward goals    Frequency    BID      PT Plan Current plan remains  appropriate    Co-evaluation             End of Session Equipment Utilized During Treatment: Gait belt Activity Tolerance: Patient tolerated treatment well Patient left: in chair;with call bell/phone within reach;with chair alarm set;with family/visitor present;with SCD's reapplied (B heels elevated via towel rolls)     Time: [id number] PT Time Calculation (min) (ACUTE ONLY): 30 min  Charges:  $Gait Training: 8-22 mins $Therapeutic Exercise: 8-22 mins                    G CodesLeitha Bleak 08/17/2016, 3:22 PM Leitha Bleak, [place]

## 2016-08-15 NOTE — Care Management (Signed)
Cost of Lovenox is $ 10.00. Patient updated.

## 2016-08-15 NOTE — Progress Notes (Signed)
Clinical Social Worker (CSW) received SNF consult. PT is recommending home health. RN case manager is aware of above. Please reconsult if future social work needs arise. CSW signing off.   Hermena Swint, LCSW [phone] 

## 2016-08-15 NOTE — Care Management Note (Signed)
Case Management Note  Patient Details  Name: Julia Moreno MRN: [id number] Date of Birth: 10-Feb-1951  Subjective/Objective:   POD # 1 left THA. Met with patient and her spouse at bedside. Spouse will be patients caregiver at home. Prior to admission, patient was independent with adl's. Discussed home health. She prefers [facility]. Uses [facility] [phone] . Call Lovenox 40 mg # 14, No refills. Patient will need a youth walker. Ordered from advanced.             Action/Plan: Referral to [facility]. Lovenox call in. Walker ordered.   Expected Discharge Date:                  Expected Discharge Plan:  [place]  In-House Referral:     Discharge planning Services  CM Consult  Post Acute Care Choice:  Durable Medical Equipment, Home Health Choice offered to:  Patient, Spouse  DME Arranged:  [facility] youth DME Agency:  [place]:  PT [place]:  [facility] ([facility])  Status of Service:  In process, will continue to follow  If discussed at Long Length of Stay Meetings, dates discussed:    Additional Comments:  Jolly Mango, RN 08/15/2016, 11:53 AM

## 2016-08-15 NOTE — Anesthesia Postprocedure Evaluation (Signed)
Anesthesia Post Note  Patient: Julia Moreno  Procedure(s) Performed: Procedure(s) (LRB): TOTAL HIP ARTHROPLASTY ANTERIOR APPROACH (Left)  Patient location during evaluation: Nursing Unit Anesthesia Type: Spinal Level of consciousness: awake, awake and alert and oriented Pain management: pain level controlled Respiratory status: spontaneous breathing, nonlabored ventilation and respiratory function stable Cardiovascular status: stable Postop Assessment: no headache and no backache Anesthetic complications: no    Last Vitals:  Vitals:   08/15/16 0600 08/15/16 0727  BP: 100/66 (!) 129/59  Pulse:  69  Resp:  16  Temp:  37 C    Last Pain:  Vitals:   08/15/16 0727  TempSrc: Oral  PainSc:                  Ricki Miller

## 2016-08-15 NOTE — Progress Notes (Signed)
Physical Therapy Treatment Patient Details Name: Julia Moreno MRN: [id number] DOB: [birth year] Today's Date: 08/15/2016    History of Present Illness Pt underwent L THR anterior approach without reported post-op complications. No reported falls in the last 12 months.    PT Comments    Pt able to progress to ambulating 120 feet CGA with RW.  Pt tolerated session well with minimal assistance for functional mobility and appearing very motivated to participate.  Will continue to progress pt with ambulation distance and increasing independence with functional mobility as able.   Follow Up Recommendations  Home health PT     Equipment Recommendations  Rolling walker with 5" wheels (Youth sized (CM notified))    Recommendations for Other Services       Precautions / Restrictions Precautions Precautions: Anterior Hip;Fall Precaution Booklet Issued: Yes (comment) Restrictions Weight Bearing Restrictions: Yes LLE Weight Bearing: Weight bearing as tolerated    Mobility  Bed Mobility               General bed mobility comments: Deferred d/t pt sitting up in chair beginning and end of session  Transfers Overall transfer level: Needs assistance Equipment used: Rolling walker (2 wheeled) Transfers: Sit to/from Stand Sit to Stand: Min guard         General transfer comment: min vc's for safe hand placement; vc's for upright posture in standing  Ambulation/Gait Ambulation/Gait assistance: Min guard Ambulation Distance (Feet): 120 Feet Assistive device: Rolling walker (2 wheeled) Gait Pattern/deviations: Step-to pattern Gait velocity: decreased   General Gait Details: decreased stance time L LE; vc's required to increase UE support through RW; no buckling noted   Stairs            Wheelchair Mobility    Modified Rankin (Stroke Patients Only)       Balance Overall balance assessment: Needs assistance Sitting-balance support: Bilateral upper extremity  supported;Feet supported Sitting balance-Leahy Scale: Normal     Standing balance support: Bilateral upper extremity supported (on RW) Standing balance-Leahy Scale: Good                      Cognition Arousal/Alertness: Awake/alert Behavior During Therapy: WFL for tasks assessed/performed Overall Cognitive Status: Within Functional Limits for tasks assessed                      Exercises Total Joint Exercises Ankle Circles/Pumps: AROM;Strengthening;Both;10 reps (Longsitting in recliner) Quad Sets: AROM;Strengthening;Both;10 reps (Longsitting in recliner) Gluteal Sets: AROM;Strengthening;Both;10 reps (Longsitting in recliner) Towel Squeeze: AROM;Strengthening;Both;10 reps (Longsitting in recliner; pillow between pt's knees) Short Arc Quad: AROM;Strengthening;Left;10 reps (Reclined in chair) Heel Slides: AAROM;Strengthening;Left;10 reps (Reclined in chair) Hip ABduction/ADduction: AAROM;Strengthening;Left;10 reps (Reclined in chair) Straight Leg Raises: AAROM;Strengthening;Left;10 reps (Reclined in chair)  Vc's required for technique of ex's.    General Comments General comments (skin integrity, edema, etc.): Pt sitting in recliner with nursing present to give pain meds at beginning of session.  Pt's husband present.  Nursing cleared pt for participation in physical therapy.  Pt agreeable to PT session.      Pertinent Vitals/Pain Pain Assessment: 0-10 Pain Score: 5  Pain Location: L hip/thigh Pain Descriptors / Indicators: Aching;Numbness;Operative site guarding Pain Intervention(s): Limited activity within patient's tolerance;Monitored during session;RN gave pain meds during session;Repositioned;Ice applied  HR and O2 vitals WFL during session on room air.    Home Living  Prior Function            PT Goals (current goals can now be found in the care plan section) Acute Rehab PT Goals Patient Stated Goal: Return to prior level  of function and play golf PT Goal Formulation: With patient/family Time For Goal Achievement: 08/28/16 Potential to Achieve Goals: Good Progress towards PT goals: Progressing toward goals    Frequency    BID      PT Plan Current plan remains appropriate    Co-evaluation             End of Session Equipment Utilized During Treatment: Gait belt Activity Tolerance: Patient tolerated treatment well Patient left: in chair;with call bell/phone within reach;with chair alarm set;with family/visitor present;with SCD's reapplied (B heels elevated via towel rolls; ice pack on L hip)     Time: [id number] PT Time Calculation (min) (ACUTE ONLY): 28 min  Charges:  $Gait Training: 8-22 mins $Therapeutic Exercise: 8-22 mins                    G CodesLeitha Bleak Sep 05, 2016, 10:22 AM Leitha Bleak, [place]

## 2016-08-15 NOTE — Progress Notes (Signed)
   Subjective: 1 Day Post-Op Procedure(s) (LRB): TOTAL HIP ARTHROPLASTY ANTERIOR APPROACH (Left) Patient reports pain as mild.   Patient is well, and has had no acute complaints or problems Denies any CP, SOB, ABD pain. We will continue therapy today.  Plan is to go Home after hospital stay.  Objective: Vital signs in last 24 hours: Temp:  [97.4 F (36.3 C)-98.6 F (37 C)] 98.6 F (37 C) (11/01 0727) Pulse Rate:  [59-82] 69 (11/01 0727) Resp:  [12-27] 16 (11/01 0727) BP: (85-137)/(55-82) 129/59 (11/01 0727) SpO2:  [91 %-100 %] 92 % (11/01 0727)  Intake/Output from previous day: 10/31 0701 - 11/01 0700 In: 1430 [P.O.:480; I.V.:900; IV Piggyback:50] Out: [id number] [Urine:2770; Blood:400] Intake/Output this shift: No intake/output data recorded.   Recent Labs  08/14/16 1053  HGB 13.5    Recent Labs  08/14/16 1053  WBC 10.1  RBC 4.30  HCT 38.3  PLT 179    Recent Labs  08/14/16 1053  CREATININE 0.67   No results for input(s): LABPT, INR in the last 72 hours.  EXAM General - Patient is Alert, Appropriate and Oriented Extremity - Neurovascular intact Sensation intact distally Intact pulses distally Dorsiflexion/Plantar flexion intact No cellulitis present Compartment soft Dressing - dressing C/D/I and no drainage Motor Function - intact, moving foot and toes well on exam.   Past Medical History:  Diagnosis Date  . Arthritis   . Diverticulosis of colon   . Hyperlipidemia   . Liver cyst   . Thyroid disease     Assessment/Plan:   1 Day Post-Op Procedure(s) (LRB): TOTAL HIP ARTHROPLASTY ANTERIOR APPROACH (Left) Active Problems:   Primary osteoarthritis of left hip  Estimated body mass index is 35.14 kg/m as calculated from the following:   Height as of this encounter: 4\' 11"  (1.499 m).   Weight as of this encounter: 78.9 kg (174 lb). Advance diet Up with therapy  Needs BM Hgb stable this am BMP pending CM to assist with discharge  DVT  Prophylaxis - Lovenox, Foot Pumps and TED hose Weight-Bearing as tolerated to left leg   T. Rachelle Hora, PA-C [place] 08/15/2016, 8:02 AM

## 2016-08-15 NOTE — Evaluation (Addendum)
Occupational Therapy Evaluation Patient Details Name: Julia Moreno MRN: [id number] DOB: 1950-11-21 Today's Date: 08/15/2016    History of Present Illness Pt. is a 65 y.o. female who was admitted to [facility] for a Left THR. Anterior Approach.   Clinical Impression   Pt is 65 year old female s/p Left THR(Anterior Approach)  who lives at home with her husband. Pt was independent in all ADLs prior to surgery and is eager to return to her  PLOF.  Pt is currently limited in functional ADLs due to pain, decreased ROM, and decreased functional mobility for ADLs.  Pt requires assist for LB ADLs due to pain and decreased AROM of Left LE.  Pt. would benefit from continued skilled OT services for education in assistive devices, functional mobility, and education in recommendations for home modifications to increase safety and prevent falls. Pt. Plans to return home with her husband.    Follow Up Recommendations  No OT follow up    Equipment Recommendations       Recommendations for Other Services       Precautions / Restrictions Precautions Precautions: Anterior Hip;Fall Precaution Booklet Issued: Yes (comment) Restrictions Weight Bearing Restrictions: Yes LLE Weight Bearing: Weight bearing as tolerated              ADL Overall ADL's : Needs assistance/impaired Eating/Feeding: Set up   Grooming: Set up               Lower Body Dressing: Minimal assistance               Functional mobility during ADLs: Min guard General ADL Comments: Pt. education was provided about A/E use for LE ADLs.     Vision     Perception     Praxis      Pertinent Vitals/Pain Pain Assessment: 0-10 Pain Score: 5  Pain Location: Left Hip/thigh Pain Descriptors / Indicators: Aching Pain Intervention(s): Limited activity within patient's tolerance;Monitored during session;RN gave pain meds during session;Repositioned;Ice applied     Hand Dominance Right   Extremity/Trunk  Assessment Upper Extremity Assessment Upper Extremity Assessment: Overall WFL for tasks assessed           Communication Communication Communication: No difficulties   Cognition Arousal/Alertness: Awake/alert Behavior During Therapy: WFL for tasks assessed/performed Overall Cognitive Status: Within Functional Limits for tasks assessed                     General Comments       Exercises       Shoulder Instructions      Home Living Family/patient expects to be discharged to:: Private residence Living Arrangements: Spouse/significant other Available Help at Discharge: Family Type of Home: House Home Access: Stairs to enter Technical brewer of Steps: 3 Entrance Stairs-Rails: Right;Left Home Layout: One level     Bathroom Shower/Tub: Walk-in shower;Door   Bathroom Toilet: Handicapped height     Home Equipment: Walker - standard;Cane - single point;Bedside commode;Shower seat - built in;Other (comment)          Prior Functioning/Environment Level of Independence: Independent        Comments: Independent with ADLs/IADLs. Drives, plays golf        OT Problem List: Decreased strength;Decreased range of motion;Decreased activity tolerance;Decreased knowledge of use of DME or AE;Impaired UE functional use;Pain;Impaired tone   OT Treatment/Interventions: Self-care/ADL training;Therapeutic exercise;Manual therapy;Therapeutic activities;Patient/family education;Energy conservation    OT Goals(Current goals can be found in the care plan section) Acute Rehab  OT Goals Patient Stated Goal: To return home, and to PLOF OT Goal Formulation: With patient Potential to Achieve Goals: Good ADL Goals Pt Will Perform Lower Body Dressing: with modified independence Pt Will Transfer to Toilet: with modified independence  OT Frequency: Min 1X/week   Barriers to D/C:            Co-evaluation              End of Session    Activity Tolerance: Patient  tolerated treatment well Patient left: with call bell/phone within reach;in chair;with chair alarm set   Time: [id number] OT Time Calculation (min): 25 min Charges:  OT General Charges $OT Visit: 1 Procedure OT Evaluation $OT Eval Moderate Complexity: 1 Procedure OT Treatments $Self Care/Home Management : 8-22 mins G-Codes:    Harrel Carina, MS, OTR/L 08/15/2016, 10:41 AM

## 2016-08-16 ENCOUNTER — Telehealth: Payer: Self-pay

## 2016-08-16 LAB — SURGICAL PATHOLOGY

## 2016-08-16 MED ORDER — METHOCARBAMOL 500 MG PO TABS: 500.0000 mg | ORAL_TABLET | Freq: Four times a day (QID) | ORAL | 0 refills | Status: DC | PRN

## 2016-08-16 MED ORDER — OXYCODONE HCL 5 MG PO TABS: 5.0000 mg | ORAL_TABLET | ORAL | 0 refills | Status: DC | PRN

## 2016-08-16 MED ORDER — ENOXAPARIN SODIUM 40 MG/0.4ML ~~LOC~~ SOLN: 40.0000 mg | SUBCUTANEOUS | 0 refills | Status: DC

## 2016-08-16 NOTE — Progress Notes (Signed)
Physical Therapy Treatment Patient Details Name: Julia Moreno MRN: [id number] DOB: 1951-05-04 Today's Date: 08/16/2016    History of Present Illness Pt. is a 65 y.o. female who was admitted to [facility] for a Left THR. Anterior Approach.    PT Comments    Pt able to ambulate around nursing loop with RW modified independently and navigate stairs safely with SBA.  Pt demonstrates appropriate safety to discharge home with support of pt's husband.  Pt and pt's husband verbalized appropriate safety for discharge home.  Recommend HHPT and initial assist with car transfers and getting into/out of bed (d/t both being elevated and needing to step up/down) and pt's husband appears to be able to provide this assist.   Follow Up Recommendations  Home health PT     Equipment Recommendations  Rolling walker with 5" wheels (Youth sized)    Recommendations for Other Services       Precautions / Restrictions Precautions Precautions: Anterior Hip;Fall Precaution Booklet Issued: Yes (comment) Restrictions Weight Bearing Restrictions: Yes LLE Weight Bearing: Weight bearing as tolerated    Mobility  Bed Mobility Overal bed mobility: Needs Assistance Bed Mobility: Supine to Sit;Sit to Supine     Supine to sit: Modified independent (Device/Increase time) Sit to supine: Modified independent (Device/Increase time)   General bed mobility comments: mild increased effort and time to perform; sit to supine; supine to sit pt logrolling to R side and then sitting up  Transfers Overall transfer level: Modified independent Equipment used: Rolling walker (2 wheeled) Transfers: Sit to/from [facility] Sit to Stand: Modified independent (Device/Increase time) Stand pivot transfers: Modified independent (Device/Increase time)       General transfer comment: steady without loss of balance; strong stand; appropriate hand and feet placement for  transfers  Ambulation/Gait Ambulation/Gait assistance: Modified independent (Device/Increase time) Ambulation Distance (Feet):  (170 feet x2) Assistive device: Rolling walker (2 wheeled) Gait Pattern/deviations: Step-through pattern Gait velocity: mildly decreased   General Gait Details: mild decreased stance time L LE; no buckling noted   Stairs Stairs: Yes Stairs assistance: Supervision Stair Management: One rail Left;Step to pattern;Sideways Number of Stairs: 4 General stair comments: initial vc's and demo and then pt able to perform stairs on own without any further cueing; pt also navigated 1 step backwards and forwards with RW CGA to simulate getting into car (step onto running board) and also getting into bed (uses step stool at home to get into bed) with 1 assist to stabilize RW  Wheelchair Mobility    Modified Rankin (Stroke Patients Only)       Balance Overall balance assessment: Modified Independent Sitting-balance support: No upper extremity supported;Feet supported Sitting balance-Leahy Scale: Normal     Standing balance support: Bilateral upper extremity supported (UE support on RW) Standing balance-Leahy Scale: Good                      Cognition Arousal/Alertness: Awake/alert Behavior During Therapy: WFL for tasks assessed/performed Overall Cognitive Status: Within Functional Limits for tasks assessed                      Exercises  Reviewed pt's HEP packet and pt verbalizing and demonstrating good understanding.    General Comments General comments (skin integrity, edema, etc.): Pt sitting in chair with husband present upon PT entering room.  Pt agreeable to PT session.     Pertinent Vitals/Pain Pain Assessment: 0-10 Pain Score: 5  (3/10 at rest beginning and  end of session; 5/10 with initial steps with gait (then decreased to 3/10 with ambulation)) Pain Location: L anterior hip/thigh Pain Descriptors / Indicators: Aching Pain  Intervention(s): Limited activity within patient's tolerance;Monitored during session;Premedicated before session;Repositioned  Vitals stable and WFL throughout treatment session.    Home Living                      Prior Function            PT Goals (current goals can now be found in the care plan section) Acute Rehab PT Goals Patient Stated Goal: to return home PT Goal Formulation: With patient/family Time For Goal Achievement: 08/28/16 Potential to Achieve Goals: Good Progress towards PT goals: Progressing toward goals    Frequency    BID      PT Plan Current plan remains appropriate    Co-evaluation             End of Session Equipment Utilized During Treatment: Gait belt Activity Tolerance: Patient tolerated treatment well Patient left: in chair;with call bell/phone within reach;with chair alarm set;with family/visitor present;with SCD's reapplied (B heels elevated via pillow)     Time: [id number] PT Time Calculation (min) (ACUTE ONLY): 30 min  Charges:  $Gait Training: 8-22 mins $Therapeutic Activity: 8-22 mins                    G CodesLeitha Bleak 09-08-16, 9:35 AM Leitha Bleak, [place]

## 2016-08-16 NOTE — Progress Notes (Signed)
Pt given Lovenox education kit and instructed how to administer injections dispose of used sharps.

## 2016-08-16 NOTE — Progress Notes (Signed)
   Subjective: 2 Days Post-Op Procedure(s) (LRB): TOTAL HIP ARTHROPLASTY ANTERIOR APPROACH (Left) Patient reports pain as mild.  Small BM last night Patient is well, and has had no acute complaints or problems Denies any CP, SOB, ABD pain. We will continue therapy today.  Plan is to go Home after hospital stay.  Objective: Vital signs in last 24 hours: Temp:  [98.2 F (36.8 C)-99.2 F (37.3 C)] 98.6 F (37 C) (11/02 0351) Pulse Rate:  [68-88] 80 (11/02 0351) Resp:  [16-18] 17 (11/02 0351) BP: (110-145)/(61-71) 119/61 (11/02 0351) SpO2:  [94 %-97 %] 95 % (11/02 0351)  Intake/Output from previous day: 11/01 0701 - 11/02 0700 In: 600 [P.O.:600] Out: 250 [Urine:250] Intake/Output this shift: No intake/output data recorded.   Recent Labs  08/14/16 1053  HGB 13.5    Recent Labs  08/14/16 1053  WBC 10.1  RBC 4.30  HCT 38.3  PLT 179    Recent Labs  08/14/16 1053  CREATININE 0.67   No results for input(s): LABPT, INR in the last 72 hours.  EXAM General - Patient is Alert, Appropriate and Oriented Extremity - Neurovascular intact Sensation intact distally Intact pulses distally Dorsiflexion/Plantar flexion intact No cellulitis present Compartment soft Dressing - dressing C/D/I and no drainage Motor Function - intact, moving foot and toes well on exam.   Past Medical History:  Diagnosis Date  . Arthritis   . Diverticulosis of colon   . Hyperlipidemia   . Liver cyst   . Thyroid disease     Assessment/Plan:   2 Days Post-Op Procedure(s) (LRB): TOTAL HIP ARTHROPLASTY ANTERIOR APPROACH (Left) Active Problems:   Primary osteoarthritis of left hip  Estimated body mass index is 35.14 kg/m as calculated from the following:   Height as of this encounter: 4\' 11"  (1.499 m).   Weight as of this encounter: 78.9 kg (174 lb). Advance diet Up with therapy  Discharge home with HHPT today Follow up with [place] [facility] in 2 weeks  DVT Prophylaxis - Lovenox, Foot  Pumps and TED hose Weight-Bearing as tolerated to left leg   T. Rachelle Hora, PA-C [place] 08/16/2016, 7:55 AM

## 2016-08-16 NOTE — Progress Notes (Deleted)
   Subjective: 2 Days Post-Op Procedure(s) (LRB): TOTAL HIP ARTHROPLASTY ANTERIOR APPROACH (Left) Patient reports pain as mild.  Small BM this am Patient is well, and has had no acute complaints or problems Denies any CP, SOB, ABD pain. We will continue therapy today.  Plan is to go Home after hospital stay.  Objective: Vital signs in last 24 hours: Temp:  [98.2 F (36.8 C)-99.2 F (37.3 C)] 98.6 F (37 C) (11/02 0351) Pulse Rate:  [68-88] 80 (11/02 0351) Resp:  [16-18] 17 (11/02 0351) BP: (110-145)/(61-71) 119/61 (11/02 0351) SpO2:  [94 %-97 %] 95 % (11/02 0351)  Intake/Output from previous day: 11/01 0701 - 11/02 0700 In: 600 [P.O.:600] Out: 250 [Urine:250] Intake/Output this shift: No intake/output data recorded.   Recent Labs  08/14/16 1053  HGB 13.5    Recent Labs  08/14/16 1053  WBC 10.1  RBC 4.30  HCT 38.3  PLT 179    Recent Labs  08/14/16 1053  CREATININE 0.67   No results for input(s): LABPT, INR in the last 72 hours.  EXAM General - Patient is Alert, Appropriate and Oriented Extremity - Neurovascular intact Sensation intact distally Intact pulses distally Dorsiflexion/Plantar flexion intact No cellulitis present Compartment soft Dressing - dressing C/D/I and no drainage Motor Function - intact, moving foot and toes well on exam.   Past Medical History:  Diagnosis Date  . Arthritis   . Diverticulosis of colon   . Hyperlipidemia   . Liver cyst   . Thyroid disease     Assessment/Plan:   2 Days Post-Op Procedure(s) (LRB): TOTAL HIP ARTHROPLASTY ANTERIOR APPROACH (Left) Active Problems:   Primary osteoarthritis of left hip  Estimated body mass index is 35.14 kg/m as calculated from the following:   Height as of this encounter: 4\' 11"  (1.499 m).   Weight as of this encounter: 78.9 kg (174 lb). Advance diet Up with therapy  Discharge home with HHPT Follow up with [facility] in 2 weeks  DVT Prophylaxis - Lovenox, Foot Pumps and TED  hose Weight-Bearing as tolerated to left leg   T. Rachelle Hora, PA-C [place] 08/16/2016, 7:48 AM

## 2016-08-16 NOTE — Telephone Encounter (Signed)
Opened telephone note in error  

## 2016-08-16 NOTE — Care Management Note (Signed)
Case Management Note  Patient Details  Name: Julia Moreno MRN: [id number] Date of Birth: 1951-03-24  Subjective/Objective:   Discharging home today with home health PT                 Action/Plan: [facility] notified of discharge. Gilford Rile has been delivered.   Expected Discharge Date:     08/16/2016             Expected Discharge Plan:  [place]  In-House Referral:     Discharge planning Services  CM Consult  Post Acute Care Choice:  Durable Medical Equipment, Home Health Choice offered to:  Patient, Spouse  DME Arranged:  [facility] Rile youth DME Agency:  [place]:  PT [place]:  [facility] (now [facility])  Status of Service:  In process, will continue to follow  If discussed at Long Length of Stay Meetings, dates discussed:    Additional Comments:  Jolly Mango, RN 08/16/2016, 9:26 AM

## 2016-08-16 NOTE — Discharge Summary (Signed)
Physician Discharge Summary  Patient ID: Julia Moreno MRN: [id number] DOB/AGE: Jan 13, 1951 65 y.o.  Admit date: 08/14/2016 Discharge date: 08/16/2016  Admission Diagnoses:  Primary Osteoarthritis   Discharge Diagnoses: Patient Active Problem List   Diagnosis Date Noted  . Primary osteoarthritis of left hip 08/14/2016  . Encounter for routine gynecological examination 07/20/2014  . Routine general medical examination at a health care facility 07/24/2013  . Polyarthralgia 03/18/2013  . Thyrotoxicosis 06/22/2010  . HLD (hyperlipidemia) 06/22/2010  . Essential hypertension 06/22/2010  . Diverticulosis of colon 06/22/2010    Past Medical History:  Diagnosis Date  . Arthritis   . Diverticulosis of colon   . Hyperlipidemia   . Liver cyst   . Thyroid disease      Transfusion: none   Consultants (if any):   Discharged Condition: Improved  Hospital Course: Julia Moreno is an 65 y.o. female who was admitted 08/14/2016 with a diagnosis of left hip osteoarthritis and went to the operating room on 08/14/2016 and underwent the above named procedures.    Surgeries: Procedure(s): TOTAL HIP ARTHROPLASTY ANTERIOR APPROACH on 08/14/2016 Patient tolerated the surgery well. Taken to PACU where she was stabilized and then transferred to the orthopedic floor.  Started on Lovenox 40 q 24 hrs. Foot pumps applied bilaterally at 80 mm. Heels elevated on bed with rolled towels. No evidence of DVT. Negative [place]. Physical therapy started on day #1 for gait training and transfer. OT started day #1 for ADL and assisted devices.  Patient's foley was d/c on day #1. Patient's IV and hemovac was d/c on day #2.  On post op day #3 patient was stable and ready for discharge to home with HHPT.  Implants: Medacta AMIS 1 standard stem, s 22.2 head, 46 mm Mpact DM cup and liner  She was given perioperative antibiotics:  Anti-infectives    Start     Dose/Rate Route Frequency Ordered Stop    08/14/16 1400  ceFAZolin (ANCEF) IVPB 1 g/50 mL premix     1 g 100 mL/hr over 30 Minutes Intravenous Every 6 hours 08/14/16 1038 08/15/16 0230   08/14/16 0601  ceFAZolin (ANCEF) 2-4 GM/100ML-% IVPB    Comments:  MILES, CHRISTINA: cabinet override      08/14/16 0601 08/14/16 0754   08/14/16 0115  ceFAZolin (ANCEF) IVPB 2g/100 mL premix     2 g 200 mL/hr over 30 Minutes Intravenous  Once 08/14/16 0109 08/14/16 0809    .  She was given sequential compression devices, early ambulation, and Lovenox for DVT prophylaxis.  She benefited maximally from the hospital stay and there were no complications.    Recent vital signs:  Vitals:   08/15/16 2004 08/16/16 0351  BP: (!) 145/71 119/61  Pulse: 88 80  Resp: 18 17  Temp: 98.2 F (36.8 C) 98.6 F (37 C)    Recent laboratory studies:  Lab Results  Component Value Date   HGB 13.5 08/14/2016   HGB 14.7 07/24/2016   HGB 15.1 (H) 07/20/2015   Lab Results  Component Value Date   WBC 10.1 08/14/2016   PLT 179 08/14/2016   Lab Results  Component Value Date   INR 0.94 07/25/2016   Lab Results  Component Value Date   NA 139 07/24/2016   K 4.0 07/24/2016   CL 103 07/24/2016   CO2 28 07/24/2016   BUN 19 07/24/2016   CREATININE 0.67 08/14/2016   GLUCOSE 94 07/24/2016    Discharge Medications:     Medication List  STOP taking these medications   aspirin EC 81 MG tablet     TAKE these medications   CALCIUM 500 +D 500-400 MG-UNIT Tabs Generic drug:  Calcium Carb-Cholecalciferol Take 1 capsule by mouth at bedtime.   enoxaparin 40 MG/0.4ML injection Commonly known as:  LOVENOX Inject 0.4 mLs (40 mg total) into the skin daily. Start taking on:  08/17/2016   fluticasone 50 MCG/ACT nasal spray Commonly known as:  FLONASE USE 1 SPRAY INTO THE NOSE DAILY.   loratadine 10 MG tablet Commonly known as:  CLARITIN Take 10 mg by mouth daily as needed for allergies.   lovastatin 40 MG tablet Commonly known as:  MEVACOR TAKE 1  TABLET (40 MG TOTAL) BY MOUTH AT BEDTIME.   magnesium gluconate 500 MG tablet Commonly known as:  MAGONATE Take 500 mg by mouth daily.   methimazole 5 MG tablet Commonly known as:  TAPAZOLE Take 5 mg by mouth daily.   MILK THISTLE EXTRACT PO Take 1 capsule by mouth daily. Take 250 mg's by mouth daily   multivitamin tablet Take 1 tablet by mouth daily.   oxyCODONE 5 MG immediate release tablet Commonly known as:  Oxy IR/ROXICODONE Take 1-2 tablets (5-10 mg total) by mouth every 4 (four) hours as needed for moderate pain.   PROBIOTIC PO Take 1 capsule by mouth daily.       Diagnostic Studies: Dg Hip Operative Unilat W Or W/o Pelvis Left  Result Date: 08/14/2016 CLINICAL DATA:  LEFT hip surgery EXAM: OPERATIVE LEFT HIP (WITH PELVIS IF PERFORMED) 2 VIEWS TECHNIQUE: Fluoroscopic spot image(s) were submitted for interpretation post-operatively. COMPARISON:  None FLUOROSCOPY TIME:  0 minutes 24 seconds Dose:  6.40 mGy FINDINGS: LEFT hip prosthesis without fracture dislocation. Tip of femoral component not imaged. IMPRESSION: LEFT hip prosthesis without visualized acute complication. Electronically Signed   By: Lavonia Dana M.D.   On: 08/14/2016 09:24   Dg Hip Unilat W Or W/o Pelvis 2-3 Views Left  Result Date: 08/14/2016 CLINICAL DATA:  Postop left hip. EXAM: DG HIP (WITH OR WITHOUT PELVIS) 2-3V LEFT COMPARISON:  No recent prior . FINDINGS: Total left hip replacement. Faint lucency noted around acetabular cup, clinical correlation suggested . Hardware intact. No acute bony abnormality. IMPRESSION: Total left hip replacement as above. Electronically Signed   By: [place]   On: 08/14/2016 10:21    Disposition: Final discharge disposition not confirmed    Follow-up Information    MENZ,MICHAEL, MD Follow up in 2 week(s).   Specialty:  Orthopedic Surgery Why:  for staple removal Contact information: [address] [place]  [address] [phone]            Signed: Dorise Hiss [facility] 08/16/2016, 7:52 AM

## 2016-08-16 NOTE — Discharge Instructions (Signed)

## 2016-08-16 NOTE — Progress Notes (Signed)
Patient was discharged home with husband. IV removed with cath intact. Reviewed meds, scripts, and last dose given. Allowed time for questions.

## 2016-09-13 ENCOUNTER — Other Ambulatory Visit: Payer: Self-pay | Admitting: Family Medicine

## 2016-09-13 DIAGNOSIS — Z1231 Encounter for screening mammogram for malignant neoplasm of breast: Secondary | ICD-10-CM

## 2016-09-26 ENCOUNTER — Other Ambulatory Visit (INDEPENDENT_AMBULATORY_CARE_PROVIDER_SITE_OTHER): Payer: Managed Care, Other (non HMO)

## 2016-09-26 DIAGNOSIS — Z1211 Encounter for screening for malignant neoplasm of colon: Secondary | ICD-10-CM

## 2016-09-26 LAB — FECAL OCCULT BLOOD, IMMUNOCHEMICAL: Fecal Occult Bld: POSITIVE — AB

## 2016-11-23 ENCOUNTER — Ambulatory Visit
Admission: RE | Admit: 2016-11-23 | Discharge: 2016-11-23 | Disposition: A | Discharge status: Home / Self Care | Payer: Managed Care, Other (non HMO) | Source: Ambulatory Visit | Attending: Family Medicine | Admitting: Family Medicine

## 2016-11-23 DIAGNOSIS — Z1231 Encounter for screening mammogram for malignant neoplasm of breast: Secondary | ICD-10-CM | POA: Diagnosis not present

## 2017-03-13 ENCOUNTER — Encounter: Payer: Self-pay | Admitting: Family Medicine

## 2017-03-15 ENCOUNTER — Ambulatory Visit: Payer: Medicare Other

## 2017-03-15 ENCOUNTER — Ambulatory Visit (INDEPENDENT_AMBULATORY_CARE_PROVIDER_SITE_OTHER): Payer: Medicare Other | Admitting: Podiatry

## 2017-03-15 DIAGNOSIS — M7751 Other enthesopathy of right foot: Secondary | ICD-10-CM

## 2017-03-15 DIAGNOSIS — M79671 Pain in right foot: Secondary | ICD-10-CM | POA: Diagnosis not present

## 2017-03-15 MED ORDER — MELOXICAM 15 MG PO TABS: 15.0000 mg | ORAL_TABLET | Freq: Every day | ORAL | 0 refills | Status: DC

## 2017-03-16 NOTE — Progress Notes (Signed)
   HPI: Patient is a 66 year old female presenting today with a complaint of pain to the right forefoot between the second and third digits that began 1-2 months ago. She states it feels as if she is walking on a rock and began after wearing flat sandals. Walking increases the pain. She has tried reflexology for treatment.    Physical Exam: General: The patient is alert and oriented x3 in no acute distress.  Dermatology: Skin is warm, dry and supple bilateral lower extremities. Negative for open lesions or macerations.  Vascular: Palpable pedal pulses bilaterally. No edema or erythema noted. Capillary refill within normal limits.  Neurological: Epicritic and protective threshold grossly intact bilaterally.   Musculoskeletal Exam: Pain with palpation to the second MPJ of the right foot. Range of motion within normal limits to all pedal and ankle joints bilateral. Muscle strength 5/5 in all groups bilateral.   Radiographic Exam:  Normal osseous mineralization. Joint spaces preserved. No fracture/dislocation/boney destruction.    Assessment: 1. Second MPJ capsulitis right   Plan of Care:  1. Patient was evaluated. X-rays reviewed. 2. Injection of 0.5 mLs Celestone Soluspan injected into the second MPJ of the right foot. 3. Metatarsal pads dispensed. 4. Prescription for meloxicam 15 mg given to patient. 5. Recommended good shoe gear. 6. Return to clinic when necessary.   [name], DPM [facility]  Dr. [name], [address]                                        [place],  [address]                Office [phone]  Fax [phone]

## 2017-04-06 MED ORDER — BETAMETHASONE SOD PHOS & ACET 6 (3-3) MG/ML IJ SUSP: 3.0000 mg | Freq: Once | INTRAMUSCULAR | Status: DC

## 2017-04-11 ENCOUNTER — Telehealth: Payer: Self-pay | Admitting: *Deleted

## 2017-04-11 MED ORDER — MELOXICAM 15 MG PO TABS: 15.0000 mg | ORAL_TABLET | Freq: Every day | ORAL | 3 refills | Status: AC

## 2017-04-11 NOTE — Telephone Encounter (Signed)
Refill Rx

## 2017-05-14 ENCOUNTER — Ambulatory Visit (INDEPENDENT_AMBULATORY_CARE_PROVIDER_SITE_OTHER): Payer: Medicare Other | Admitting: Podiatry

## 2017-05-14 DIAGNOSIS — M7751 Other enthesopathy of right foot: Secondary | ICD-10-CM | POA: Diagnosis not present

## 2017-05-14 DIAGNOSIS — E0500 Thyrotoxicosis with diffuse goiter without thyrotoxic crisis or storm: Secondary | ICD-10-CM | POA: Diagnosis not present

## 2017-05-14 MED ORDER — BETAMETHASONE SOD PHOS & ACET 6 (3-3) MG/ML IJ SUSP: 3.0000 mg | Freq: Once | INTRAMUSCULAR | Status: DC

## 2017-05-14 NOTE — Progress Notes (Signed)
   HPI: 66 year old female presents to the office today for follow-up treatment and evaluation regarding second MTPJ capsulitis right foot. Patient states that she was doing better and then a knot came back and she now has pain is been going on for the past month. The injection did help last visit. Patient presents today for follow-up treatment and evaluation    Physical Exam: General: The patient is alert and oriented x3 in no acute distress.  Dermatology: Skin is warm, dry and supple bilateral lower extremities. Negative for open lesions or macerations.  Vascular: Palpable pedal pulses bilaterally. No edema or erythema noted. Capillary refill within normal limits.  Neurological: Epicritic and protective threshold grossly intact bilaterally.   Musculoskeletal Exam: Pain with palpation to the second MPJ of the right foot. Range of motion within normal limits to all pedal and ankle joints bilateral. Muscle strength 5/5 in all groups bilateral.  There is also evidence of hammertoe contracture deformity to the second digit right foot contributory to the spectrum capsulitis   Assessment: 1. Second MPJ capsulitis right 2. Hammertoe second digit right foot   Plan of Care:  1. Patient was evaluated. X-rays reviewed. 2. Injection of 0.5 mLs Celestone Soluspan injected into the second MPJ of the right foot. 3. Metatarsal pads dispensed. 4. Today a Darco toe splint was dispensed to splint the hammertoe down into a rectus position 5. Continue good shoe gear. I did discuss conservative versus surgical management. I explained to patient that if his symptoms do not improve over the course of the next few visits we will likely need to do surgery to address the hammertoe and capsule contracture 6. Return to clinic in 4 weeks for follow-up treatment and evaluation.   [name], DPM [facility]  Dr. [name], [address]                                          [place], [place] [address]                Office [phone]  Fax [phone]

## 2017-05-20 DIAGNOSIS — E0500 Thyrotoxicosis with diffuse goiter without thyrotoxic crisis or storm: Secondary | ICD-10-CM | POA: Diagnosis not present

## 2017-06-14 ENCOUNTER — Ambulatory Visit: Payer: Medicare Other | Admitting: Podiatry

## 2017-07-18 ENCOUNTER — Other Ambulatory Visit: Payer: Self-pay | Admitting: Family Medicine

## 2017-07-18 DIAGNOSIS — Z0000 Encounter for general adult medical examination without abnormal findings: Secondary | ICD-10-CM

## 2017-07-18 DIAGNOSIS — E785 Hyperlipidemia, unspecified: Secondary | ICD-10-CM

## 2017-07-25 ENCOUNTER — Other Ambulatory Visit (INDEPENDENT_AMBULATORY_CARE_PROVIDER_SITE_OTHER): Payer: Medicare Other

## 2017-07-25 DIAGNOSIS — Z0000 Encounter for general adult medical examination without abnormal findings: Secondary | ICD-10-CM | POA: Diagnosis not present

## 2017-07-25 LAB — CBC WITH DIFFERENTIAL/PLATELET
BASOS ABS: 0.1 10*3/uL (ref 0.0–0.1)
Basophils Relative: 0.9 % (ref 0.0–3.0)
EOS ABS: 0.1 10*3/uL (ref 0.0–0.7)
Eosinophils Relative: 2.2 % (ref 0.0–5.0)
HEMATOCRIT: 43.3 % (ref 36.0–46.0)
Hemoglobin: 14.7 g/dL (ref 12.0–15.0)
LYMPHS PCT: 31.2 % (ref 12.0–46.0)
Lymphs Abs: 1.9 10*3/uL (ref 0.7–4.0)
MCHC: 33.9 g/dL (ref 30.0–36.0)
MCV: 93.6 fl (ref 78.0–100.0)
MONOS PCT: 7.4 % (ref 3.0–12.0)
Monocytes Absolute: 0.4 10*3/uL (ref 0.1–1.0)
NEUTROS ABS: 3.5 10*3/uL (ref 1.4–7.7)
Neutrophils Relative %: 58.3 % (ref 43.0–77.0)
PLATELETS: 231 10*3/uL (ref 150.0–400.0)
RBC: 4.62 Mil/uL (ref 3.87–5.11)
RDW: 12.7 % (ref 11.5–15.5)
WBC: 6 10*3/uL (ref 4.0–10.5)

## 2017-07-25 LAB — TSH: TSH: 2.36 u[IU]/mL (ref 0.35–4.50)

## 2017-07-25 LAB — COMPREHENSIVE METABOLIC PANEL
ALK PHOS: 70 U/L (ref 39–117)
ALT: 18 U/L (ref 0–35)
AST: 14 U/L (ref 0–37)
Albumin: 4.2 g/dL (ref 3.5–5.2)
BILIRUBIN TOTAL: 0.6 mg/dL (ref 0.2–1.2)
BUN: 13 mg/dL (ref 6–23)
CALCIUM: 9.1 mg/dL (ref 8.4–10.5)
CO2: 31 mEq/L (ref 19–32)
CREATININE: 0.89 mg/dL (ref 0.40–1.20)
Chloride: 103 mEq/L (ref 96–112)
GFR: 67.55 mL/min (ref >60.00)
GLUCOSE: 93 mg/dL (ref 70–99)
Potassium: 4.1 mEq/L (ref 3.5–5.1)
Sodium: 140 mEq/L (ref 135–145)
TOTAL PROTEIN: 6.6 g/dL (ref 6.0–8.3)

## 2017-07-25 LAB — LIPID PANEL
Cholesterol: 183 mg/dL (ref 0–200)
HDL: 39.4 mg/dL (ref >39.00)
NonHDL: 143.87
Total CHOL/HDL Ratio: 5
Triglycerides: 202 mg/dL — ABNORMAL HIGH (ref 0.0–149.0)
VLDL: 40.4 mg/dL — ABNORMAL HIGH (ref 0.0–40.0)

## 2017-07-25 LAB — LDL CHOLESTEROL, DIRECT: LDL DIRECT: 113 mg/dL

## 2017-07-30 ENCOUNTER — Encounter: Payer: Self-pay | Admitting: Family Medicine

## 2017-07-30 ENCOUNTER — Ambulatory Visit (INDEPENDENT_AMBULATORY_CARE_PROVIDER_SITE_OTHER): Payer: Medicare Other | Admitting: Family Medicine

## 2017-07-30 VITALS — BP 126/76 | HR 79 | Temp 98.1°F | Ht 60.0 in | Wt 174.8 lb

## 2017-07-30 DIAGNOSIS — Z0000 Encounter for general adult medical examination without abnormal findings: Secondary | ICD-10-CM | POA: Diagnosis not present

## 2017-07-30 DIAGNOSIS — I10 Essential (primary) hypertension: Secondary | ICD-10-CM

## 2017-07-30 DIAGNOSIS — Z23 Encounter for immunization: Secondary | ICD-10-CM | POA: Diagnosis not present

## 2017-07-30 DIAGNOSIS — E079 Disorder of thyroid, unspecified: Secondary | ICD-10-CM

## 2017-07-30 DIAGNOSIS — Z780 Asymptomatic menopausal state: Secondary | ICD-10-CM

## 2017-07-30 DIAGNOSIS — E785 Hyperlipidemia, unspecified: Secondary | ICD-10-CM

## 2017-07-30 NOTE — Progress Notes (Signed)
Subjective:   Patient ID: Julia Moreno, female    DOB: [birth year], 66 y.o.   MRN: [id number]  Julia Moreno is a pleasant 66 y.o. year old female who presents to clinic today with Welcome to Medicare (Patient is here today for a Welcome to Medicare PE. )  on 07/30/2017  HPI:  I have personally reviewed the Medicare Annual Wellness questionnaire and have noted 1. The patient's medical and social history 2. Their use of alcohol, tobacco or illicit drugs 3. Their current medications and supplements 4. The patient's functional ability including ADL's, fall risks, home safety risks and hearing or visual             impairment. 5. Diet and physical activities 6. Evidence for depression or mood disorders  End of life wishes discussed and updated in Social History.  The roster of all physicians providing medical care to patient - is listed in the CareTeams section of the chart.  Health Maintenance  Topic Date Due  . COLONOSCOPY  03/22/2015  . DEXA SCAN  01/24/2017  . PNA vac Low Risk Adult (1 of 2 - PCV13) 01/24/2017  . INFLUENZA VACCINE  05/15/2017  . MAMMOGRAM  11/23/2018  . PAP SMEAR  07/27/2019  . TETANUS/TDAP  07/20/2024  . Hepatitis C Screening  Completed  . HIV Screening  Completed   HLD-   on Mevacor 40 mg.  Denies myalgias.  Lab Results  Component Value Date   CHOL 183 07/25/2017   HDL 39.40 07/25/2017   LDLCALC 123 (H) 07/13/2014   LDLDIRECT 113.0 07/25/2017   TRIG 202.0 (H) 07/25/2017   CHOLHDL 5 07/25/2017   Lab Results  Component Value Date   ALT 18 07/25/2017   AST 14 07/25/2017   ALKPHOS 70 07/25/2017   BILITOT 0.6 07/25/2017   Hyperthyroidism-  Has endocrinologist. Biopsy neg years ago, told it was thyroiditis.  Lab Results  Component Value Date   TSH 2.36 07/25/2017   Current Outpatient Prescriptions on File Prior to Visit  Medication Sig Dispense Refill  . aspirin EC 81 MG tablet Take by mouth.    . Calcium Carb-Cholecalciferol  (CALCIUM 500 +D) 500-400 MG-UNIT TABS Take 1 capsule by mouth at bedtime.      . fluticasone (FLONASE) 50 MCG/ACT nasal spray USE 1 SPRAY INTO THE NOSE DAILY. 16 g 1  . loratadine (CLARITIN) 10 MG tablet Take 10 mg by mouth daily as needed for allergies.    Marland Kitchen lovastatin (MEVACOR) 40 MG tablet TAKE 1 TABLET (40 MG TOTAL) BY MOUTH AT BEDTIME. 30 tablet 0  . magnesium gluconate (MAGONATE) 500 MG tablet Take 500 mg by mouth daily.    . methimazole (TAPAZOLE) 5 MG tablet Take 5 mg by mouth daily.    . Milk Thistle Extract 175 MG TABS Take by mouth.    Marland Kitchen MILK THISTLE EXTRACT PO Take 1 capsule by mouth daily. Take 250 mg's by mouth daily    . Multiple Vitamin (MULTIVITAMIN) tablet Take 1 tablet by mouth daily.     No current facility-administered medications on file prior to visit.     Allergies  Allergen Reactions  . Meperidine Nausea And Vomiting  . Meperidine Hcl     REACTION: vomiting  . Sulfamethoxazole-Trimethoprim Hives and Rash    REACTION: hives REACTION: hives    Past Medical History:  Diagnosis Date  . Arthritis   . Diverticulosis of colon   . Hyperlipidemia   . Liver cyst   . Thyroid  disease     Past Surgical History:  Procedure Laterality Date  . BONE SPUR     REMOVED FROM BOTHE BIG TOES 2010  . BREAST BIOPSY Left 2017   NEG  . TONSILLECTOMY    . TOTAL HIP ARTHROPLASTY Left 08/14/2016   Procedure: TOTAL HIP ARTHROPLASTY ANTERIOR APPROACH;  Surgeon: Hessie Knows, MD;  Location: [facility] ORS;  Service: Orthopedics;  Laterality: Left;    Family History  Problem Relation Age of Onset  . Hyperlipidemia Mother   . Hypertension Mother   . Dementia Father   . Breast cancer Neg Hx     Social History   Social History  . Marital status: Married    Spouse name: N/A  . Number of children: N/A  . Years of education: N/A   Occupational History  . Not on file.   Social History Main Topics  . Smoking status: Never Smoker  . Smokeless tobacco: Never Used  . Alcohol  use Yes     Comment: occassional  . Drug use: No  . Sexual activity: Not on file   Other Topics Concern  . Not on file   Social History Narrative   Recently moved here from [place]      Son is internist hospitalist      Full code   The PMH, [place], Social History, Family History, Medications, and allergies have been reviewed in [facility], and have been updated if relevant.     Review of Systems  Constitutional: Negative.   HENT: Negative.   Eyes: Negative.   Respiratory: Negative.   Cardiovascular: Negative.   Gastrointestinal: Negative.   Endocrine: Negative.   Genitourinary: Negative.   Musculoskeletal: Negative.   Allergic/Immunologic: Negative.   Neurological: Negative.   Hematological: Negative.   Psychiatric/Behavioral: Negative.   All other systems reviewed and are negative.      Objective:    BP 126/76 (BP Location: Left Arm, Patient Position: Sitting, Cuff Size: Normal)   Pulse 79   Temp 98.1 F (36.7 C) (Oral)   Ht 5' (1.524 m)   Wt 174 lb 12.8 oz (79.3 kg)   SpO2 95%   BMI 34.14 kg/m    Physical Exam   General:  Well-developed,well-nourished,in no acute distress; alert,appropriate and cooperative throughout examination Head:  normocephalic and atraumatic.   Eyes:  vision grossly intact, PERRL Ears:  R ear normal and L ear normal externally, TMs clear bilaterally Nose:  no external deformity.   Mouth:  good dentition.   Neck:  No deformities, masses, or tenderness noted. Breasts:  No mass, nodules, thickening, tenderness, bulging, retraction, inflamation, nipple discharge or skin changes noted.   Lungs:  Normal respiratory effort, chest expands symmetrically. Lungs are clear to auscultation, no crackles or wheezes. Heart:  Normal rate and regular rhythm. S1 and S2 normal without gallop, murmur, click, rub or other extra sounds. Abdomen:  Bowel sounds positive,abdomen soft and non-tender without masses, organomegaly or hernias noted. Msk:  No deformity or  scoliosis noted of thoracic or lumbar spine.   Extremities:  No clubbing, cyanosis, edema, or deformity noted with normal full range of motion of all joints.   Neurologic:  alert & oriented X3 and gait normal.   Skin:  Intact without suspicious lesions or rashes Cervical Nodes:  No lymphadenopathy noted Axillary Nodes:  No palpable lymphadenopathy Psych:  Cognition and judgment appear intact. Alert and cooperative with normal attention span and concentration. No apparent delusions, illusions, hallucinations       Assessment &  Plan:   Welcome to Medicare preventive visit - Plan: EKG 12-Lead  Hyperlipidemia, unspecified hyperlipidemia type  Essential hypertension - Plan: EKG 12-Lead  Thyroid disease No Follow-up on file.

## 2017-07-30 NOTE — Assessment & Plan Note (Addendum)
The patients weight, height, BMI and visual acuity have been recorded in the chart.  Cognitive function assessed.   I have made referrals, counseling and provided education to the patient based review of the above and I have provided the pt with a written personalized care plan for preventive services.  EKG NSR.  Prevnar 13 and influenza vaccines given today.

## 2017-07-30 NOTE — Assessment & Plan Note (Signed)
Clinically euthyroid.  No changes made. 

## 2017-07-30 NOTE — Assessment & Plan Note (Signed)
Well controlled. No changes made to rxs. 

## 2017-07-30 NOTE — Patient Instructions (Signed)
Great to see you!   

## 2017-08-01 ENCOUNTER — Other Ambulatory Visit: Payer: Self-pay | Admitting: Family Medicine

## 2017-08-02 MED ORDER — LOVASTATIN 40 MG PO TABS: 40.0000 mg | ORAL_TABLET | Freq: Every day | ORAL | 0 refills | Status: AC

## 2017-08-02 NOTE — Telephone Encounter (Signed)
Patient is requesting a refill on this medication, it hasn't been refilled since 04/21/15. Okay to refill?

## 2017-08-27 DIAGNOSIS — Z1212 Encounter for screening for malignant neoplasm of rectum: Secondary | ICD-10-CM | POA: Diagnosis not present

## 2017-08-27 DIAGNOSIS — Z1211 Encounter for screening for malignant neoplasm of colon: Secondary | ICD-10-CM | POA: Diagnosis not present

## 2017-08-29 ENCOUNTER — Ambulatory Visit
Admission: RE | Admit: 2017-08-29 | Discharge: 2017-08-29 | Disposition: A | Discharge status: Home / Self Care | Payer: Medicare Other | Source: Ambulatory Visit | Attending: Family Medicine | Admitting: Family Medicine

## 2017-08-29 DIAGNOSIS — Z780 Asymptomatic menopausal state: Secondary | ICD-10-CM | POA: Diagnosis not present

## 2017-08-29 DIAGNOSIS — M85851 Other specified disorders of bone density and structure, right thigh: Secondary | ICD-10-CM | POA: Diagnosis not present

## 2017-08-29 DIAGNOSIS — Z96649 Presence of unspecified artificial hip joint: Secondary | ICD-10-CM | POA: Insufficient documentation

## 2017-08-29 LAB — COLOGUARD: COLOGUARD: NEGATIVE

## 2017-09-10 ENCOUNTER — Encounter: Payer: Self-pay | Admitting: Internal Medicine

## 2017-09-10 ENCOUNTER — Encounter: Payer: Self-pay | Admitting: Family Medicine

## 2017-09-17 ENCOUNTER — Ambulatory Visit (INDEPENDENT_AMBULATORY_CARE_PROVIDER_SITE_OTHER): Payer: Medicare Other | Admitting: Internal Medicine

## 2017-09-17 ENCOUNTER — Encounter: Payer: Self-pay | Admitting: Internal Medicine

## 2017-09-17 DIAGNOSIS — M150 Primary generalized (osteo)arthritis: Secondary | ICD-10-CM

## 2017-09-17 DIAGNOSIS — E7800 Pure hypercholesterolemia, unspecified: Secondary | ICD-10-CM | POA: Diagnosis not present

## 2017-09-17 DIAGNOSIS — J302 Other seasonal allergic rhinitis: Secondary | ICD-10-CM | POA: Diagnosis not present

## 2017-09-17 DIAGNOSIS — M159 Polyosteoarthritis, unspecified: Secondary | ICD-10-CM

## 2017-09-17 DIAGNOSIS — E079 Disorder of thyroid, unspecified: Secondary | ICD-10-CM | POA: Diagnosis not present

## 2017-09-17 NOTE — Assessment & Plan Note (Signed)
She is very active No medications needed at this time Advised her she can take Aleve as needecd for pain

## 2017-09-17 NOTE — Assessment & Plan Note (Signed)
Continue Claritin and Flonase prn

## 2017-09-17 NOTE — Assessment & Plan Note (Signed)
Recent labs normal Continue Tapazole She will continue to follow with endo

## 2017-09-17 NOTE — Patient Instructions (Signed)
Fat and Cholesterol Restricted Diet Getting too much fat and cholesterol in your diet may cause health problems. Following this diet helps keep your fat and cholesterol at normal levels. This can keep you from getting sick. What types of fat should I choose?  Choose monosaturated and polyunsaturated fats. These are found in foods such as olive oil, canola oil, flaxseeds, walnuts, almonds, and seeds.  Eat more omega-3 fats. Good choices include salmon, mackerel, sardines, tuna, flaxseed oil, and ground flaxseeds.  Limit saturated fats. These are in animal products such as meats, butter, and cream. They can also be in plant products such as palm oil, palm kernel oil, and coconut oil.  Avoid foods with partially hydrogenated oils in them. These contain trans fats. Examples of foods that have trans fats are stick margarine, some tub margarines, cookies, crackers, and other baked goods. What general guidelines do I need to follow?  Check food labels. Look for the words "trans fat" and "saturated fat."  When preparing a meal: ? Fill half of your plate with vegetables and green salads. ? Fill one fourth of your plate with whole grains. Look for the word "whole" as the first word in the ingredient list. ? Fill one fourth of your plate with lean protein foods.  Eat more foods that have fiber, like apples, carrots, beans, peas, and barley.  Eat more home-cooked foods. Eat less at restaurants and buffets.  Limit or avoid alcohol.  Limit foods high in starch and sugar.  Limit fried foods.  Cook foods without frying them. Baking, boiling, grilling, and broiling are all great options.  Lose weight if you are overweight. Losing even a small amount of weight can help your overall health. It can also help prevent diseases such as diabetes and heart disease. What foods can I eat? Grains Whole grains, such as whole wheat or whole grain breads, crackers, cereals, and pasta. Unsweetened oatmeal,  bulgur, barley, quinoa, or brown rice. Corn or whole wheat flour tortillas. Vegetables Fresh or frozen vegetables (raw, steamed, roasted, or grilled). Green salads. Fruits All fresh, canned (in natural juice), or frozen fruits. Meat and Other Protein Products Ground beef (85% or leaner), grass-fed beef, or beef trimmed of fat. Skinless chicken or turkey. Ground chicken or turkey. Pork trimmed of fat. All fish and seafood. Eggs. Dried beans, peas, or lentils. Unsalted nuts or seeds. Unsalted canned or dry beans. Dairy Low-fat dairy products, such as skim or 1% milk, 2% or reduced-fat cheeses, low-fat ricotta or cottage cheese, or plain low-fat yogurt. Fats and Oils Tub margarines without trans fats. Light or reduced-fat mayonnaise and salad dressings. Avocado. Olive, canola, sesame, or safflower oils. Natural peanut or almond butter (choose ones without added sugar and oil). The items listed above may not be a complete list of recommended foods or beverages. Contact your dietitian for more options. What foods are not recommended? Grains White bread. White pasta. White rice. Cornbread. Bagels, pastries, and croissants. Crackers that contain trans fat. Vegetables White potatoes. Corn. Creamed or fried vegetables. Vegetables in a cheese sauce. Fruits Dried fruits. Canned fruit in light or heavy syrup. Fruit juice. Meat and Other Protein Products Fatty cuts of meat. Ribs, chicken wings, bacon, sausage, bologna, salami, chitterlings, fatback, hot dogs, bratwurst, and packaged luncheon meats. Liver and organ meats. Dairy Whole or 2% milk, cream, half-and-half, and cream cheese. Whole milk cheeses. Whole-fat or sweetened yogurt. Full-fat cheeses. Nondairy creamers and whipped toppings. Processed cheese, cheese spreads, or cheese curds. Sweets and Desserts Corn   syrup, sugars, honey, and molasses. Candy. Jam and jelly. Syrup. Sweetened cereals. Cookies, pies, cakes, donuts, muffins, and ice  cream. Fats and Oils Butter, stick margarine, lard, shortening, ghee, or bacon fat. Coconut, palm kernel, or palm oils. Beverages Alcohol. Sweetened drinks (such as sodas, lemonade, and fruit drinks or punches). The items listed above may not be a complete list of foods and beverages to avoid. Contact your dietitian for more information. This information is not intended to replace advice given to you by your health care provider. Make sure you discuss any questions you have with your health care provider. Document Released: 04/01/2012 Document Revised: 06/07/2016 Document Reviewed: 12/31/2013 Elsevier Interactive Patient Education  2018 [employer]  

## 2017-09-17 NOTE — Assessment & Plan Note (Signed)
Controlled on Lovastatin Encouraged her to consume a low fat diet

## 2017-09-17 NOTE — Progress Notes (Signed)
HPI  Pt presents to the clinic today to establish care and for management of the conditions listed below. She is transferring care from Dr. Deborra Medina.  Arthritis: In all her major joints. She does not take anything OTC for this.  HLD: Her last LDL was 113, 07/2017. She denies myalgias on Lovastatin. She tries to consume a low fat diet.  Hyperthyroidism: Her thyroid levels were last checked 07/2017- normal. She denies any issues on her current dose of Tapazole. She follows with Dr. Gabriel Carina.  Seasonal Allergies: Worse in the spring and fall. She takes Claritin and Flonase as needed with good relief.  Flu: 07/2017 Tetanus: 07/2014 Prevnar: 07/2017 Pap Smear: 07/2016 Mammogram: 11/2016 Bone Density: 08/2017 Colon Screening: 08/2017-Cologuard Vision Screening: as needed  Past Medical History:  Diagnosis Date  . Arthritis   . Diverticulosis of colon   . Hyperlipidemia   . Liver cyst   . Thyroid disease     Current Outpatient Medications  Medication Sig Dispense Refill  . aspirin EC 81 MG tablet Take by mouth.    . Calcium Carb-Cholecalciferol (CALCIUM 500 +D) 500-400 MG-UNIT TABS Take 1 capsule by mouth at bedtime.      . fluticasone (FLONASE) 50 MCG/ACT nasal spray USE 1 SPRAY INTO THE NOSE DAILY. (Patient taking differently: Place 2 sprays into both nostrils daily as needed. USE 1 SPRAY INTO THE NOSE DAILY.) 16 g 1  . loratadine (CLARITIN) 10 MG tablet Take 10 mg by mouth daily as needed for allergies.    Marland Kitchen lovastatin (MEVACOR) 40 MG tablet Take 1 tablet (40 mg total) by mouth at bedtime. 30 tablet 0  . magnesium gluconate (MAGONATE) 500 MG tablet Take 500 mg by mouth daily.    . methimazole (TAPAZOLE) 5 MG tablet Take 5 mg by mouth daily.    . Milk Thistle Extract 175 MG TABS Take by mouth.    Marland Kitchen MILK THISTLE EXTRACT PO Take 1 capsule by mouth daily. Take 250 mg's by mouth daily     No current facility-administered medications for this visit.     Allergies  Allergen Reactions  .  Meperidine Nausea And Vomiting  . Meperidine Hcl     REACTION: vomiting  . Sulfamethoxazole-Trimethoprim Hives and Rash    REACTION: hives REACTION: hives    Family History  Problem Relation Age of Onset  . Hyperlipidemia Mother   . Hypertension Mother   . Dementia Father   . Breast cancer Neg Hx     Social History   Socioeconomic History  . Marital status: Married    Spouse name: Not on file  . Number of children: Not on file  . Years of education: Not on file  . Highest education level: Not on file  Social Needs  . Financial resource strain: Not on file  . Food insecurity - worry: Not on file  . Food insecurity - inability: Not on file  . Transportation needs - medical: Not on file  . Transportation needs - non-medical: Not on file  Occupational History  . Not on file  Tobacco Use  . Smoking status: Never Smoker  . Smokeless tobacco: Never Used  Substance and Sexual Activity  . Alcohol use: Yes    Comment: occassional  . Drug use: No  . Sexual activity: Not on file  Other Topics Concern  . 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  HEENT: Denies eye pain, eye redness, ear pain, ringing in the ears, wax buildup, runny nose, nasal congestion, bloody nose, or sore throat. Respiratory: Denies difficulty breathing, shortness of breath, cough or sputum production.   Cardiovascular: Denies chest pain, chest tightness, palpitations or swelling in the hands or feet.  Musculoskeletal: Denies decrease in range of motion, difficulty with gait, muscle pain or joint pain and swelling.  Skin: Denies redness, rashes, lesions or ulcercations.  Neurological: Denies dizziness, difficulty with memory, difficulty with speech or problems with balance and coordination.  Psych: Denies anxiety, depression,  SI/HI.  No other specific complaints in a complete review of systems (except as listed in HPI above).  PE:  BP 124/76   Pulse 76   Temp 98 F (36.7 C) (Oral)   Wt 177 lb (80.3 kg)   SpO2 97%   BMI 34.57 kg/m   Wt Readings from Last 3 Encounters:  09/17/17 177 lb (80.3 kg)  07/30/17 174 lb 12.8 oz (79.3 kg)  08/14/16 174 lb (78.9 kg)    General: Appears her stated age, obese in NAD. HEENT: Head: normal shape and size;  Ears: Tm's gray and intact, normal light reflex;Throat/Mouth: Teeth present, mucosa pink and moist, no lesions or ulcerations noted.  Neck: Neck supple, trachea midline. No masses, lumps or present.  Cardiovascular: Normal rate and rhythm. Pulmonary/Chest: Normal effort and positive vesicular breath sounds. No respiratory distress. No wheezes, rales or ronchi noted.  Musculoskeletal: No difficulty with gait.  Neurological: Alert and oriented.   Psychiatric: Mood and affect normal. Behavior is normal. Judgment and thought content normal.     BMET    Component Value Date/Time   NA 140 07/25/2017 0821   K 4.1 07/25/2017 0821   CL 103 07/25/2017 0821   CO2 31 07/25/2017 0821   GLUCOSE 93 07/25/2017 0821   BUN 13 07/25/2017 0821   CREATININE 0.89 07/25/2017 0821   CALCIUM 9.1 07/25/2017 0821   GFRNONAA >60 08/14/2016 1053   GFRAA >60 08/14/2016 1053    Lipid Panel     Component Value Date/Time   CHOL 183 07/25/2017 0821   TRIG 202.0 (H) 07/25/2017 0821   HDL 39.40 07/25/2017 0821   CHOLHDL 5 07/25/2017 0821   VLDL 40.4 (H) 07/25/2017 0821   LDLCALC 123 (H) 07/13/2014 0855    CBC    Component Value Date/Time   WBC 6.0 07/25/2017 0821   RBC 4.62 07/25/2017 0821   HGB 14.7 07/25/2017 0821   HCT 43.3 07/25/2017 0821   PLT 231.0 07/25/2017 0821   MCV 93.6 07/25/2017 0821   MCH 31.4 08/14/2016 1053   MCHC 33.9 07/25/2017 0821   RDW 12.7 07/25/2017 0821   LYMPHSABS 1.9 07/25/2017 0821   MONOABS 0.4 07/25/2017 0821   EOSABS 0.1 07/25/2017 0821    BASOSABS 0.1 07/25/2017 0821    Hgb A1C No results found for: HGBA1C   Assessment and Plan:

## 2017-10-11 ENCOUNTER — Other Ambulatory Visit: Payer: Self-pay | Admitting: Family Medicine

## 2017-10-11 ENCOUNTER — Other Ambulatory Visit: Payer: Self-pay | Admitting: Internal Medicine

## 2017-10-11 DIAGNOSIS — Z1231 Encounter for screening mammogram for malignant neoplasm of breast: Secondary | ICD-10-CM

## 2017-12-17 DIAGNOSIS — H5203 Hypermetropia, bilateral: Secondary | ICD-10-CM | POA: Diagnosis not present

## 2017-12-17 DIAGNOSIS — Z83518 Family history of other specified eye disorder: Secondary | ICD-10-CM | POA: Diagnosis not present

## 2017-12-17 DIAGNOSIS — H25013 Cortical age-related cataract, bilateral: Secondary | ICD-10-CM | POA: Diagnosis not present

## 2017-12-17 DIAGNOSIS — H524 Presbyopia: Secondary | ICD-10-CM | POA: Diagnosis not present

## 2017-12-17 DIAGNOSIS — H2513 Age-related nuclear cataract, bilateral: Secondary | ICD-10-CM | POA: Diagnosis not present

## 2017-12-19 ENCOUNTER — Ambulatory Visit
Admission: RE | Admit: 2017-12-19 | Discharge: 2017-12-19 | Disposition: A | Discharge status: Home / Self Care | Payer: Medicare HMO | Source: Ambulatory Visit | Attending: Internal Medicine | Admitting: Internal Medicine

## 2017-12-19 DIAGNOSIS — Z1231 Encounter for screening mammogram for malignant neoplasm of breast: Secondary | ICD-10-CM | POA: Diagnosis not present

## 2017-12-26 DIAGNOSIS — L720 Epidermal cyst: Secondary | ICD-10-CM | POA: Diagnosis not present

## 2017-12-26 DIAGNOSIS — D225 Melanocytic nevi of trunk: Secondary | ICD-10-CM | POA: Diagnosis not present

## 2017-12-26 DIAGNOSIS — L816 Other disorders of diminished melanin formation: Secondary | ICD-10-CM | POA: Diagnosis not present

## 2017-12-26 DIAGNOSIS — L814 Other melanin hyperpigmentation: Secondary | ICD-10-CM | POA: Diagnosis not present

## 2018-01-06 DIAGNOSIS — R69 Illness, unspecified: Secondary | ICD-10-CM | POA: Diagnosis not present

## 2018-01-14 DIAGNOSIS — E0500 Thyrotoxicosis with diffuse goiter without thyrotoxic crisis or storm: Secondary | ICD-10-CM | POA: Diagnosis not present

## 2018-01-24 DIAGNOSIS — E0500 Thyrotoxicosis with diffuse goiter without thyrotoxic crisis or storm: Secondary | ICD-10-CM | POA: Diagnosis not present

## 2018-04-23 DIAGNOSIS — R69 Illness, unspecified: Secondary | ICD-10-CM | POA: Diagnosis not present

## 2018-08-01 ENCOUNTER — Ambulatory Visit: Payer: PRIVATE HEALTH INSURANCE | Admitting: Internal Medicine

## 2018-08-04 DIAGNOSIS — R69 Illness, unspecified: Secondary | ICD-10-CM | POA: Diagnosis not present

## 2018-08-28 ENCOUNTER — Ambulatory Visit (INDEPENDENT_AMBULATORY_CARE_PROVIDER_SITE_OTHER): Payer: Medicare HMO | Admitting: Internal Medicine

## 2018-08-28 ENCOUNTER — Encounter: Payer: Self-pay | Admitting: Internal Medicine

## 2018-08-28 VITALS — BP 124/80 | HR 76 | Temp 97.9°F | Ht 60.0 in | Wt 177.0 lb

## 2018-08-28 DIAGNOSIS — E079 Disorder of thyroid, unspecified: Secondary | ICD-10-CM

## 2018-08-28 DIAGNOSIS — M150 Primary generalized (osteo)arthritis: Secondary | ICD-10-CM

## 2018-08-28 DIAGNOSIS — E7800 Pure hypercholesterolemia, unspecified: Secondary | ICD-10-CM | POA: Diagnosis not present

## 2018-08-28 DIAGNOSIS — R140 Abdominal distension (gaseous): Secondary | ICD-10-CM

## 2018-08-28 DIAGNOSIS — E559 Vitamin D deficiency, unspecified: Secondary | ICD-10-CM | POA: Diagnosis not present

## 2018-08-28 DIAGNOSIS — Z0000 Encounter for general adult medical examination without abnormal findings: Secondary | ICD-10-CM | POA: Diagnosis not present

## 2018-08-28 DIAGNOSIS — M159 Polyosteoarthritis, unspecified: Secondary | ICD-10-CM

## 2018-08-28 NOTE — Patient Instructions (Signed)
Health Maintenance for Postmenopausal Women Menopause is a normal process in which your reproductive ability comes to an end. This process happens gradually over a span of months to years, usually between the ages of 22 and 9. Menopause is complete when you have missed 12 consecutive menstrual periods. It is important to talk with your health care provider about some of the most common conditions that affect postmenopausal women, such as heart disease, cancer, and bone loss (osteoporosis). Adopting a healthy lifestyle and getting preventive care can help to promote your health and wellness. Those actions can also lower your chances of developing some of these common conditions. What should I know about menopause? During menopause, you may experience a number of symptoms, such as:  Moderate-to-severe hot flashes.  Night sweats.  Decrease in sex drive.  Mood swings.  Headaches.  Tiredness.  Irritability.  Memory problems.  Insomnia.  Choosing to treat or not to treat menopausal changes is an individual decision that you make with your health care provider. What should I know about hormone replacement therapy and supplements? Hormone therapy products are effective for treating symptoms that are associated with menopause, such as hot flashes and night sweats. Hormone replacement carries certain risks, especially as you become older. If you are thinking about using estrogen or estrogen with progestin treatments, discuss the benefits and risks with your health care provider. What should I know about heart disease and stroke? Heart disease, heart attack, and stroke become more likely as you age. This may be due, in part, to the hormonal changes that your body experiences during menopause. These can affect how your body processes dietary fats, triglycerides, and cholesterol. Heart attack and stroke are both medical emergencies. There are many things that you can do to help prevent heart disease  and stroke:  Have your blood pressure checked at least every 1-2 years. High blood pressure causes heart disease and increases the risk of stroke.  If you are 53-22 years old, ask your health care provider if you should take aspirin to prevent a heart attack or a stroke.  Do not use any tobacco products, including cigarettes, chewing tobacco, or electronic cigarettes. If you need help quitting, ask your health care provider.  It is important to eat a healthy diet and maintain a healthy weight. ? Be sure to include plenty of vegetables, fruits, low-fat dairy products, and lean protein. ? Avoid eating foods that are high in solid fats, added sugars, or salt (sodium).  Get regular exercise. This is one of the most important things that you can do for your health. ? Try to exercise for at least 150 minutes each week. The type of exercise that you do should increase your heart rate and make you sweat. This is known as moderate-intensity exercise. ? Try to do strengthening exercises at least twice each week. Do these in addition to the moderate-intensity exercise.  Know your numbers.Ask your health care provider to check your cholesterol and your blood glucose. Continue to have your blood tested as directed by your health care provider.  What should I know about cancer screening? There are several types of cancer. Take the following steps to reduce your risk and to catch any cancer development as early as possible. Breast Cancer  Practice breast self-awareness. ? This means understanding how your breasts normally appear and feel. ? It also means doing regular breast self-exams. Let your health care provider know about any changes, no matter how small.  If you are 40  or older, have a clinician do a breast exam (clinical breast exam or CBE) every year. Depending on your age, family history, and medical history, it may be recommended that you also have a yearly breast X-ray (mammogram).  If you  have a family history of breast cancer, talk with your health care provider about genetic screening.  If you are at high risk for breast cancer, talk with your health care provider about having an MRI and a mammogram every year.  Breast cancer (BRCA) gene test is recommended for women who have family members with BRCA-related cancers. Results of the assessment will determine the need for genetic counseling and BRCA1 and for BRCA2 testing. BRCA-related cancers include these types: ? Breast. This occurs in males or females. ? Ovarian. ? Tubal. This may also be called fallopian tube cancer. ? Cancer of the abdominal or pelvic lining (peritoneal cancer). ? Prostate. ? Pancreatic.  Cervical, Uterine, and Ovarian Cancer Your health care provider may recommend that you be screened regularly for cancer of the pelvic organs. These include your ovaries, uterus, and vagina. This screening involves a pelvic exam, which includes checking for microscopic changes to the surface of your cervix (Pap test).  For women ages 21-65, health care providers may recommend a pelvic exam and a Pap test every three years. For women ages 79-65, they may recommend the Pap test and pelvic exam, combined with testing for human papilloma virus (HPV), every five years. Some types of HPV increase your risk of cervical cancer. Testing for HPV may also be done on women of any age who have unclear Pap test results.  Other health care providers may not recommend any screening for nonpregnant women who are considered low risk for pelvic cancer and have no symptoms. Ask your health care provider if a screening pelvic exam is right for you.  If you have had past treatment for cervical cancer or a condition that could lead to cancer, you need Pap tests and screening for cancer for at least 20 years after your treatment. If Pap tests have been discontinued for you, your risk factors (such as having a new sexual partner) need to be  reassessed to determine if you should start having screenings again. Some women have medical problems that increase the chance of getting cervical cancer. In these cases, your health care provider may recommend that you have screening and Pap tests more often.  If you have a family history of uterine cancer or ovarian cancer, talk with your health care provider about genetic screening.  If you have vaginal bleeding after reaching menopause, tell your health care provider.  There are currently no reliable tests available to screen for ovarian cancer.  Lung Cancer Lung cancer screening is recommended for adults 69-62 years old who are at high risk for lung cancer because of a history of smoking. A yearly low-dose CT scan of the lungs is recommended if you:  Currently smoke.  Have a history of at least 30 pack-years of smoking and you currently smoke or have quit within the past 15 years. A pack-year is smoking an average of one pack of cigarettes per day for one year.  Yearly screening should:  Continue until it has been 15 years since you quit.  Stop if you develop a health problem that would prevent you from having lung cancer treatment.  Colorectal Cancer  This type of cancer can be detected and can often be prevented.  Routine colorectal cancer screening usually begins at  age 42 and continues through age 45.  If you have risk factors for colon cancer, your health care provider may recommend that you be screened at an earlier age.  If you have a family history of colorectal cancer, talk with your health care provider about genetic screening.  Your health care provider may also recommend using home test kits to check for hidden blood in your stool.  A small camera at the end of a tube can be used to examine your colon directly (sigmoidoscopy or colonoscopy). This is done to check for the earliest forms of colorectal cancer.  Direct examination of the colon should be repeated every  5-10 years until age 71. However, if early forms of precancerous polyps or small growths are found or if you have a family history or genetic risk for colorectal cancer, you may need to be screened more often.  Skin Cancer  Check your skin from head to toe regularly.  Monitor any moles. Be sure to tell your health care provider: ? About any new moles or changes in moles, especially if there is a change in a mole's shape or color. ? If you have a mole that is larger than the size of a pencil eraser.  If any of your family members has a history of skin cancer, especially at a young age, talk with your health care provider about genetic screening.  Always use sunscreen. Apply sunscreen liberally and repeatedly throughout the day.  Whenever you are outside, protect yourself by wearing long sleeves, pants, a wide-brimmed hat, and sunglasses.  What should I know about osteoporosis? Osteoporosis is a condition in which bone destruction happens more quickly than new bone creation. After menopause, you may be at an increased risk for osteoporosis. To help prevent osteoporosis or the bone fractures that can happen because of osteoporosis, the following is recommended:  If you are 46-71 years old, get at least 1,000 mg of calcium and at least 600 mg of vitamin D per day.  If you are older than age 55 but younger than age 65, get at least 1,200 mg of calcium and at least 600 mg of vitamin D per day.  If you are older than age 54, get at least 1,200 mg of calcium and at least 800 mg of vitamin D per day.  Smoking and excessive alcohol intake increase the risk of osteoporosis. Eat foods that are rich in calcium and vitamin D, and do weight-bearing exercises several times each week as directed by your health care provider. What should I know about how menopause affects my mental health? Depression may occur at any age, but it is more common as you become older. Common symptoms of depression  include:  Low or sad mood.  Changes in sleep patterns.  Changes in appetite or eating patterns.  Feeling an overall lack of motivation or enjoyment of activities that you previously enjoyed.  Frequent crying spells.  Talk with your health care provider if you think that you are experiencing depression. What should I know about immunizations? It is important that you get and maintain your immunizations. These include:  Tetanus, diphtheria, and pertussis (Tdap) booster vaccine.  Influenza every year before the flu season begins.  Pneumonia vaccine.  Shingles vaccine.  Your health care provider may also recommend other immunizations. This information is not intended to replace advice given to you by your health care provider. Make sure you discuss any questions you have with your health care provider. Document Released: 11/23/2005  Document Revised: 04/20/2016 Document Reviewed: 07/05/2015 Elsevier Interactive Patient Education  2018 [employer]  

## 2018-08-28 NOTE — Assessment & Plan Note (Signed)
She will continue Methimazole as prescribed She will continue to follow with Dr. Gabriel Carina

## 2018-08-28 NOTE — Assessment & Plan Note (Signed)
CMET and Lipid profile today Encouraged her to consume a low fat diet Continue Lovastatin for now 

## 2018-08-28 NOTE — Assessment & Plan Note (Signed)
Encouraged regular physical activity, stretching

## 2018-08-28 NOTE — Progress Notes (Signed)
HPI:  Pt presents to the clinic today for his Medicare Wellness Exam.  Osteoarthritis: Mainly in her hips. She has had her left hip replaced and thinks she is ready to have her right hip replaced.  HLD: Her last LDL was 113, 07/2017. She denies myalgias on Lovastatin. She tries to consume a low fat diet.  Hyperthyroid: She is taking Methimazole as prescribed. She follows with Dr. Gabriel Carina.   Past Medical History:  Diagnosis Date  . Arthritis   . Diverticulosis of colon   . Hyperlipidemia   . Liver cyst   . Thyroid disease     Current Outpatient Medications  Medication Sig Dispense Refill  . aspirin EC 81 MG tablet Take by mouth.    . Calcium Carb-Cholecalciferol (CALCIUM 500 +D) 500-400 MG-UNIT TABS Take 1 capsule by mouth at bedtime.      . fluticasone (FLONASE) 50 MCG/ACT nasal spray USE 1 SPRAY INTO THE NOSE DAILY. (Patient taking differently: Place 2 sprays into both nostrils daily as needed. USE 1 SPRAY INTO THE NOSE DAILY.) 16 g 1  . loratadine (CLARITIN) 10 MG tablet Take 10 mg by mouth daily as needed for allergies.    Marland Kitchen lovastatin (MEVACOR) 40 MG tablet Take 1 tablet (40 mg total) by mouth at bedtime. 30 tablet 0  . magnesium gluconate (MAGONATE) 500 MG tablet Take 500 mg by mouth daily.    . methimazole (TAPAZOLE) 5 MG tablet Take 5 mg by mouth daily.    . Milk Thistle Extract 175 MG TABS Take by mouth.    Marland Kitchen MILK THISTLE EXTRACT PO Take 1 capsule by mouth daily. Take 250 mg's by mouth daily     No current facility-administered medications for this visit.     Allergies  Allergen Reactions  . Meperidine Nausea And Vomiting  . Meperidine Hcl     REACTION: vomiting  . Sulfamethoxazole-Trimethoprim Hives and Rash    REACTION: hives REACTION: hives    Family History  Problem Relation Age of Onset  . Hyperlipidemia Mother   . Hypertension Mother   . Dementia Father   . Breast cancer Neg Hx     Social History   Socioeconomic History  . Marital status: Married    Spouse name: Not on file  . Number of children: Not on file  . Years of education: Not on file  . Highest education level: Not on file  Occupational History  . Not on file  Social Needs  . Financial resource strain: Not on file  . Food insecurity:    Worry: Not on file    Inability: Not on file  . Transportation needs:    Medical: Not on file    Non-medical: Not on file  Tobacco Use  . Smoking status: Never Smoker  . Smokeless tobacco: Never Used  Substance and Sexual Activity  . Alcohol use: Yes    Comment: occassional  . Drug use: No  . Sexual activity: Not on file  Lifestyle  . Physical activity:    Days per week: Not on file    Minutes per session: Not on file  . Stress: Not on file  Relationships  . Social connections:    Talks on phone: Not on file    Gets together: Not on file    Attends religious service: Not on file    Active member of club or organization: Not on file    Attends meetings of clubs or organizations: Not on file    Relationship status:  Not on file  . Intimate partner violence:    Fear of current or ex partner: Not on file    Emotionally abused: Not on file    Physically abused: Not on file    Forced sexual activity: Not on file  Other Topics Concern  . Not on file  Social History Narrative   Recently moved here from [place]      Son is internist hospitalist      Full code    Hospitiliaztions: None  Health Maintenance:    Flu: 07/2017  Tetanus: 07/2014  Pneumovax: never  Prevnar: 07/2017  Zostavax: 07/2014  Mammogram: 12/2017  Pap Smear: 07/2016  Bone Density: 08/2017  Colon Screening: 03/2010, Cologuard 08/2017  Eye Doctor: annually, Dr Gershon Crane  Dental Exam: biannually   Providers:   PCP: Webb Silversmith, NP-C  Podiatry: Dr. Amalia Hailey  Endocrinology: Dr. Gabriel Carina  Orthopedics: Dr. Rudene Christians   I have personally reviewed and have noted:  1. The patient's medical and social history 2. Their use of alcohol, tobacco or illicit drugs 3. Their  current medications and supplements 4. The patient's functional ability including ADL's, fall risks, home safety risks and hearing or visual impairment. 5. Diet and physical activities 6. Evidence for depression or mood disorder  Subjective:   Review of Systems:   Constitutional: Denies fever, malaise, fatigue, headache or abrupt weight changes.  HEENT: Denies eye pain, eye redness, ear pain, ringing in the ears, wax buildup, runny nose, nasal congestion, bloody nose, or sore throat. Respiratory: Denies difficulty breathing, shortness of breath, cough or sputum production.   Cardiovascular: Denies chest pain, chest tightness, palpitations or swelling in the hands or feet.  Gastrointestinal: Pt reports abdominal bloating, gassiness. Denies abdominal pain, constipation, diarrhea or blood in the stool.  GU: Denies urgency, frequency, pain with urination, burning sensation, blood in urine, odor or discharge. Musculoskeletal: Pt reports intermittent right hip pain. Denies decrease in range of motion, difficulty with gait, muscle pain or joint swelling.  Skin: Denies redness, rashes, lesions or ulcercations.  Neurological: Denies dizziness, difficulty with memory, difficulty with speech or problems with balance and coordination.  Psych: Denies anxiety, depression, SI/HI.  No other specific complaints in a complete review of systems (except as listed in HPI above).  Objective:  PE:   BP 124/80   Pulse 76   Temp 97.9 F (36.6 C) (Oral)   Ht 5' (1.524 m)   Wt 177 lb (80.3 kg)   SpO2 98%   BMI 34.57 kg/m   Wt Readings from Last 3 Encounters:  09/17/17 177 lb (80.3 kg)  07/30/17 174 lb 12.8 oz (79.3 kg)  08/14/16 174 lb (78.9 kg)    General: Appears her stated age, obese, in NAD. Skin: Warm, dry and intact.  HEENT: Head: normal shape and size; Eyes: sclera white, no icterus, conjunctiva pink, PERRLA and EOMs intact; Ears: Tm's gray and intact, normal light reflex; Throat/Mouth: Teeth  present, mucosa pink and moist, no exudate, lesions or ulcerations noted.  Neck: Neck supple, trachea midline. No masses, lumps or thyromegaly present.  Cardiovascular: Normal rate and rhythm. S1,S2 noted.  No murmur, rubs or gallops noted. No JVD or BLE edema. No carotid bruits noted. Pulmonary/Chest: Normal effort and positive vesicular breath sounds. No respiratory distress. No wheezes, rales or ronchi noted.  Abdomen: Soft and nontender. Normal bowel sounds. No distention or masses noted. Liver, spleen and kidneys non palpable. Musculoskeletal:  Strength 5/5 BUE/BLE. No difficulty with gait.  Neurological: Alert and oriented. Cranial  nerves II-XII grossly intact. Coordination normal.  Psychiatric: Mood and affect normal. Behavior is normal. Judgment and thought content normal.     BMET    Component Value Date/Time   NA 140 07/25/2017 0821   K 4.1 07/25/2017 0821   CL 103 07/25/2017 0821   CO2 31 07/25/2017 0821   GLUCOSE 93 07/25/2017 0821   BUN 13 07/25/2017 0821   CREATININE 0.89 07/25/2017 0821   CALCIUM 9.1 07/25/2017 0821   GFRNONAA >60 08/14/2016 1053   GFRAA >60 08/14/2016 1053    Lipid Panel     Component Value Date/Time   CHOL 183 07/25/2017 0821   TRIG 202.0 (H) 07/25/2017 0821   HDL 39.40 07/25/2017 0821   CHOLHDL 5 07/25/2017 0821   VLDL 40.4 (H) 07/25/2017 0821   LDLCALC 123 (H) 07/13/2014 0855    CBC    Component Value Date/Time   WBC 6.0 07/25/2017 0821   RBC 4.62 07/25/2017 0821   HGB 14.7 07/25/2017 0821   HCT 43.3 07/25/2017 0821   PLT 231.0 07/25/2017 0821   MCV 93.6 07/25/2017 0821   MCH 31.4 08/14/2016 1053   MCHC 33.9 07/25/2017 0821   RDW 12.7 07/25/2017 0821   LYMPHSABS 1.9 07/25/2017 0821   MONOABS 0.4 07/25/2017 0821   EOSABS 0.1 07/25/2017 0821   BASOSABS 0.1 07/25/2017 0821    Hgb A1C No results found for: HGBA1C    Assessment and Plan:   Medicare Annual Wellness Visit:  Diet: She does eat some meat. She consumes fruits  and veggies daily. She does not eat fried foods. She drinks mostly water. Physical activity: She goes to the gym 3 days a week for 1 hour, walking, golfing Depression/mood screen: Negative Hearing: Intact to whispered voice Visual acuity: Grossly normal, performs annual eye exam  ADLs: Capable Fall risk: None Home safety: Good Cognitive evaluation: Intact to orientation, naming, recall and repetition EOL planning: Adv directives, full code/ I agree  Preventative Medicine: Flu and pneumovax given today. Tetanus, prevnar and zostovax UTD. Pap smear, mammogram, colon screening and bone density UTD. Encouraged her to consume a balanced diet and exercise regimen. Advised her to see an eye doctor and dentist annually. Will check CBC, CMET, Lipid, H Pylori and Vit D today.  Abdominal Bloating, Gassiness:  Will check H Pylori today  Next appointment: 1 year, Medicare Wellness Exam   Webb Silversmith, NP

## 2018-08-29 LAB — COMPREHENSIVE METABOLIC PANEL
ALK PHOS: 67 U/L (ref 39–117)
ALT: 18 U/L (ref 0–35)
AST: 14 U/L (ref 0–37)
Albumin: 4.5 g/dL (ref 3.5–5.2)
BILIRUBIN TOTAL: 0.5 mg/dL (ref 0.2–1.2)
BUN: 20 mg/dL (ref 6–23)
CO2: 30 mEq/L (ref 19–32)
Calcium: 9.8 mg/dL (ref 8.4–10.5)
Chloride: 103 mEq/L (ref 96–112)
Creatinine, Ser: 0.98 mg/dL (ref 0.40–1.20)
GFR: 60.24 mL/min (ref >60.00)
Glucose, Bld: 89 mg/dL (ref 70–99)
POTASSIUM: 3.9 meq/L (ref 3.5–5.1)
SODIUM: 140 meq/L (ref 135–145)
TOTAL PROTEIN: 7.1 g/dL (ref 6.0–8.3)

## 2018-08-29 LAB — CBC
HEMATOCRIT: 44.5 % (ref 36.0–46.0)
HEMOGLOBIN: 15.3 g/dL — AB (ref 12.0–15.0)
MCHC: 34.5 g/dL (ref 30.0–36.0)
MCV: 91.2 fl (ref 78.0–100.0)
Platelets: 232 10*3/uL (ref 150.0–400.0)
RBC: 4.88 Mil/uL (ref 3.87–5.11)
RDW: 12.4 % (ref 11.5–15.5)
WBC: 7 10*3/uL (ref 4.0–10.5)

## 2018-08-29 LAB — LDL CHOLESTEROL, DIRECT: Direct LDL: 138 mg/dL

## 2018-08-29 LAB — LIPID PANEL
Cholesterol: 207 mg/dL — ABNORMAL HIGH (ref 0–200)
HDL: 45.1 mg/dL (ref >39.00)
NonHDL: 161.54
Total CHOL/HDL Ratio: 5
Triglycerides: 257 mg/dL — ABNORMAL HIGH (ref 0.0–149.0)
VLDL: 51.4 mg/dL — AB (ref 0.0–40.0)

## 2018-08-29 LAB — VITAMIN D 25 HYDROXY (VIT D DEFICIENCY, FRACTURES): VITD: 43.14 ng/mL (ref 30.00–100.00)

## 2018-08-29 LAB — H. PYLORI ANTIBODY, IGG: H Pylori IgG: NEGATIVE

## 2018-09-04 ENCOUNTER — Other Ambulatory Visit: Payer: Self-pay | Admitting: Internal Medicine

## 2018-09-04 DIAGNOSIS — E7800 Pure hypercholesterolemia, unspecified: Secondary | ICD-10-CM

## 2018-09-04 DIAGNOSIS — E669 Obesity, unspecified: Secondary | ICD-10-CM

## 2018-09-19 DIAGNOSIS — E0500 Thyrotoxicosis with diffuse goiter without thyrotoxic crisis or storm: Secondary | ICD-10-CM | POA: Diagnosis not present

## 2018-09-24 DIAGNOSIS — R69 Illness, unspecified: Secondary | ICD-10-CM | POA: Diagnosis not present

## 2018-09-26 DIAGNOSIS — E0500 Thyrotoxicosis with diffuse goiter without thyrotoxic crisis or storm: Secondary | ICD-10-CM | POA: Diagnosis not present

## 2018-11-06 DIAGNOSIS — R69 Illness, unspecified: Secondary | ICD-10-CM | POA: Diagnosis not present

## 2018-11-10 ENCOUNTER — Other Ambulatory Visit: Payer: Self-pay | Admitting: Internal Medicine

## 2018-11-10 DIAGNOSIS — Z1231 Encounter for screening mammogram for malignant neoplasm of breast: Secondary | ICD-10-CM

## 2018-12-30 ENCOUNTER — Other Ambulatory Visit: Payer: Self-pay | Admitting: Internal Medicine

## 2018-12-30 DIAGNOSIS — E7800 Pure hypercholesterolemia, unspecified: Secondary | ICD-10-CM

## 2019-01-05 ENCOUNTER — Other Ambulatory Visit: Payer: Medicare HMO

## 2019-03-24 DIAGNOSIS — L720 Epidermal cyst: Secondary | ICD-10-CM | POA: Diagnosis not present

## 2019-03-24 DIAGNOSIS — L309 Dermatitis, unspecified: Secondary | ICD-10-CM | POA: Diagnosis not present

## 2019-03-24 DIAGNOSIS — L821 Other seborrheic keratosis: Secondary | ICD-10-CM | POA: Diagnosis not present

## 2019-03-24 DIAGNOSIS — H5203 Hypermetropia, bilateral: Secondary | ICD-10-CM | POA: Diagnosis not present

## 2019-03-24 DIAGNOSIS — H25813 Combined forms of age-related cataract, bilateral: Secondary | ICD-10-CM | POA: Diagnosis not present

## 2019-03-24 DIAGNOSIS — D1801 Hemangioma of skin and subcutaneous tissue: Secondary | ICD-10-CM | POA: Diagnosis not present

## 2019-03-24 DIAGNOSIS — D229 Melanocytic nevi, unspecified: Secondary | ICD-10-CM | POA: Diagnosis not present

## 2019-03-24 DIAGNOSIS — L814 Other melanin hyperpigmentation: Secondary | ICD-10-CM | POA: Diagnosis not present

## 2019-03-24 DIAGNOSIS — H524 Presbyopia: Secondary | ICD-10-CM | POA: Diagnosis not present

## 2019-04-23 ENCOUNTER — Other Ambulatory Visit: Payer: Self-pay

## 2019-04-23 ENCOUNTER — Ambulatory Visit
Admission: RE | Admit: 2019-04-23 | Discharge: 2019-04-23 | Disposition: A | Discharge status: Home / Self Care | Payer: Medicare HMO | Source: Ambulatory Visit | Attending: Internal Medicine | Admitting: Internal Medicine

## 2019-04-23 DIAGNOSIS — Z1231 Encounter for screening mammogram for malignant neoplasm of breast: Secondary | ICD-10-CM

## 2019-05-24 ENCOUNTER — Encounter: Payer: Self-pay | Admitting: Internal Medicine

## 2019-05-27 ENCOUNTER — Encounter: Payer: Self-pay | Admitting: Internal Medicine

## 2019-05-27 ENCOUNTER — Ambulatory Visit (INDEPENDENT_AMBULATORY_CARE_PROVIDER_SITE_OTHER): Payer: Medicare HMO | Admitting: Internal Medicine

## 2019-05-27 ENCOUNTER — Other Ambulatory Visit: Payer: Self-pay

## 2019-05-27 VITALS — BP 124/84 | HR 59 | Temp 98.1°F | Wt 166.0 lb

## 2019-05-27 DIAGNOSIS — M25511 Pain in right shoulder: Secondary | ICD-10-CM | POA: Diagnosis not present

## 2019-05-27 MED ORDER — CYCLOBENZAPRINE HCL 5 MG PO TABS: 5.0000 mg | ORAL_TABLET | Freq: Every evening | ORAL | 0 refills | Status: DC | PRN

## 2019-05-27 MED ORDER — PREDNISONE 10 MG PO TABS: ORAL_TABLET | ORAL | 0 refills | Status: DC

## 2019-05-27 NOTE — Progress Notes (Signed)
Subjective:    Patient ID: Julia Moreno, female    DOB: 1950/11/27, 68 y.o.   MRN: [id number]  HPI  Pt presents to the clinic today with c/o right shoulder pain. She reports this started 3 weeks ago. She describes the pain as dull and achy. The pain does not radiate. She has noticed a "knot" in her right upper back. The pain seems worse at night. She denies numbness, tingling or weakness. She denies any injury to the area but has had to stop playing golf due to the pain. She has tried massage, ice and Ibuprofen with minimal relief.   Review of Systems   Past Medical History:  Diagnosis Date  . Arthritis   . Diverticulosis of colon   . Hyperlipidemia   . Liver cyst   . Thyroid disease     Current Outpatient Medications  Medication Sig Dispense Refill  . aspirin EC 81 MG tablet Take by mouth.    . Calcium Carb-Cholecalciferol (CALCIUM 500 +D) 500-400 MG-UNIT TABS Take 1 capsule by mouth at bedtime.      . fluticasone (FLONASE) 50 MCG/ACT nasal spray USE 1 SPRAY INTO THE NOSE DAILY. (Patient taking differently: Place 2 sprays into both nostrils daily as needed. USE 1 SPRAY INTO THE NOSE DAILY.) 16 g 1  . loratadine (CLARITIN) 10 MG tablet Take 10 mg by mouth daily as needed for allergies.    Marland Kitchen lovastatin (MEVACOR) 40 MG tablet Take 1 tablet (40 mg total) by mouth at bedtime. 30 tablet 0  . magnesium gluconate (MAGONATE) 500 MG tablet Take 500 mg by mouth daily.    . methimazole (TAPAZOLE) 5 MG tablet Take 5 mg by mouth daily.    . Milk Thistle Extract 175 MG TABS Take by mouth.     No current facility-administered medications for this visit.     Allergies  Allergen Reactions  . Meperidine Nausea And Vomiting  . Meperidine Hcl     REACTION: vomiting  . Sulfamethoxazole-Trimethoprim Hives and Rash    REACTION: hives REACTION: hives    Family History  Problem Relation Age of Onset  . Hyperlipidemia Mother   . Hypertension Mother   . Dementia Father   . Breast  cancer Neg Hx     Social History   Socioeconomic History  . Marital status: Married    Spouse name: Not on file  . Number of children: Not on file  . Years of education: Not on file  . Highest education level: Not on file  Occupational History  . Not on file  Social Needs  . Financial resource strain: Not on file  . Food insecurity    Worry: Not on file    Inability: Not on file  . Transportation needs    Medical: Not on file    Non-medical: Not on file  Tobacco Use  . Smoking status: Never Smoker  . Smokeless tobacco: Never Used  Substance and Sexual Activity  . Alcohol use: Yes    Comment: occassional  . Drug use: No  . Sexual activity: Not on file  Lifestyle  . Physical activity    Days per week: Not on file    Minutes per session: Not on file  . Stress: Not on file  Relationships  . Social Herbalist on phone: Not on file    Gets together: Not on file    Attends religious service: Not on file    Active member of club or  organization: Not on file    Attends meetings of clubs or organizations: Not on file    Relationship status: Not on file  . Intimate partner violence    Fear of current or ex partner: Not on file    Emotionally abused: Not on file    Physically abused: Not on file    Forced sexual activity: Not on file  Other Topics Concern  . Not on file  Social History Narrative   Recently moved here from [place]      Son is internist hospitalist      Full code     Constitutional: Denies fever, malaise, fatigue, headache or abrupt weight changes.  Respiratory: Denies difficulty breathing, shortness of breath, cough or sputum production.   Cardiovascular: Denies chest pain, chest tightness, palpitations or swelling in the hands or feet.  Musculoskeletal: Pt reports right shoulder pain. Denies decrease in range of motion, difficulty with gait, muscle pain or joint swelling.  Neurological: Denies numbness, tingling, weakness or problems with  coordination.   No other specific complaints in a complete review of systems (except as listed in HPI above).  Objective:   Physical Exam   BP 124/84   Pulse (!) 59   Temp 98.1 F (36.7 C) (Temporal)   Wt 166 lb (75.3 kg)   SpO2 98%   BMI 32.42 kg/m  Wt Readings from Last 3 Encounters:  05/27/19 166 lb (75.3 kg)  08/28/18 177 lb (80.3 kg)  09/17/17 177 lb (80.3 kg)    General: Appears her stated age, well developed, well nourished in NAD. Cardiovascular: Normal rate and rhythm. No JVD or BLE edema.  Pulmonary/Chest: Normal effort and positive vesicular breath sounds. No respiratory distress. No wheezes, rales or ronchi noted.  Musculoskeletal: Normal internal and external ROM. Pain with palpation of the right AC joint, para thoracic muscles, medial to the right scapula. Negative drop can test. Strength 5/5 BUE/BLE. Neurological: Alert and oriented.  Coordination normal.    BMET    Component Value Date/Time   NA 140 08/28/2018 1448   K 3.9 08/28/2018 1448   CL 103 08/28/2018 1448   CO2 30 08/28/2018 1448   GLUCOSE 89 08/28/2018 1448   BUN 20 08/28/2018 1448   CREATININE 0.98 08/28/2018 1448   CALCIUM 9.8 08/28/2018 1448   GFRNONAA >60 08/14/2016 1053   GFRAA >60 08/14/2016 1053    Lipid Panel     Component Value Date/Time   CHOL 207 (H) 08/28/2018 1448   TRIG 257.0 (H) 08/28/2018 1448   HDL 45.10 08/28/2018 1448   CHOLHDL 5 08/28/2018 1448   VLDL 51.4 (H) 08/28/2018 1448   LDLCALC 123 (H) 07/13/2014 0855    CBC    Component Value Date/Time   WBC 7.0 08/28/2018 1448   RBC 4.88 08/28/2018 1448   HGB 15.3 (H) 08/28/2018 1448   HCT 44.5 08/28/2018 1448   PLT 232.0 08/28/2018 1448   MCV 91.2 08/28/2018 1448   MCH 31.4 08/14/2016 1053   MCHC 34.5 08/28/2018 1448   RDW 12.4 08/28/2018 1448   LYMPHSABS 1.9 07/25/2017 0821   MONOABS 0.4 07/25/2017 0821   EOSABS 0.1 07/25/2017 0821   BASOSABS 0.1 07/25/2017 0821    Hgb A1C No results found for: HGBA1C          Assessment & Plan:   Acute Right Shoulder Pain:  RX for Pred Taper x 6 days RX for Flexeril 5 mg PO QHS prn- sedation caution given Continue ice Shoulder exercises given  Return precautions discussed Webb Silversmith, NP

## 2019-05-27 NOTE — Patient Instructions (Signed)
Shoulder Exercises Ask your health care provider which exercises are safe for you. Do exercises exactly as told by your health care provider and adjust them as directed. It is normal to feel mild stretching, pulling, tightness, or discomfort as you do these exercises. Stop right away if you feel sudden pain or your pain gets worse. Do not begin these exercises until told by your health care provider. Stretching exercises External rotation and abduction This exercise is sometimes called corner stretch. This exercise rotates your arm outward (external rotation) and moves your arm out from your body (abduction). 1. Stand in a doorway with one of your feet slightly in front of the other. This is called a staggered stance. If you cannot reach your forearms to the door frame, stand facing a corner of a room. 2. Choose one of the following positions as told by your health care provider: ? Place your hands and forearms on the door frame above your head. ? Place your hands and forearms on the door frame at the height of your head. ? Place your hands on the door frame at the height of your elbows. 3. Slowly move your weight onto your front foot until you feel a stretch across your chest and in the front of your shoulders. Keep your head and chest upright and keep your abdominal muscles tight. 4. Hold for __________ seconds. 5. To release the stretch, shift your weight to your back foot. Repeat __________ times. Complete this exercise __________ times a day. Extension, standing 1. Stand and hold a broomstick, a cane, or a similar object behind your back. ? Your hands should be a little wider than shoulder width apart. ? Your palms should face away from your back. 2. Keeping your elbows straight and your shoulder muscles relaxed, move the stick away from your body until you feel a stretch in your shoulders (extension). ? Avoid shrugging your shoulders while you move the stick. Keep your shoulder blades tucked  down toward the middle of your back. 3. Hold for __________ seconds. 4. Slowly return to the starting position. Repeat __________ times. Complete this exercise __________ times a day. Range-of-motion exercises Pendulum  1. Stand near a wall or a surface that you can hold onto for balance. 2. Bend at the waist and let your left / right arm hang straight down. Use your other arm to support you. Keep your back straight and do not lock your knees. 3. Relax your left / right arm and shoulder muscles, and move your hips and your trunk so your left / right arm swings freely. Your arm should swing because of the motion of your body, not because you are using your arm or shoulder muscles. 4. Keep moving your hips and trunk so your arm swings in the following directions, as told by your health care provider: ? Side to side. ? Forward and backward. ? In clockwise and counterclockwise circles. 5. Continue each motion for __________ seconds, or for as long as told by your health care provider. 6. Slowly return to the starting position. Repeat __________ times. Complete this exercise __________ times a day. Shoulder flexion, standing  1. Stand and hold a broomstick, a cane, or a similar object. Place your hands a little more than shoulder width apart on the object. Your left / right hand should be palm up, and your other hand should be palm down. 2. Keep your elbow straight and your shoulder muscles relaxed. Push the stick up with your healthy arm to   raise your left / right arm in front of your body, and then over your head until you feel a stretch in your shoulder (flexion). ? Avoid shrugging your shoulder while you raise your arm. Keep your shoulder blade tucked down toward the middle of your back. 3. Hold for __________ seconds. 4. Slowly return to the starting position. Repeat __________ times. Complete this exercise __________ times a day. Shoulder abduction, standing 1. Stand and hold a broomstick,  a cane, or a similar object. Place your hands a little more than shoulder width apart on the object. Your left / right hand should be palm up, and your other hand should be palm down. 2. Keep your elbow straight and your shoulder muscles relaxed. Push the object across your body toward your left / right side. Raise your left / right arm to the side of your body (abduction) until you feel a stretch in your shoulder. ? Do not raise your arm above shoulder height unless your health care provider tells you to do that. ? If directed, raise your arm over your head. ? Avoid shrugging your shoulder while you raise your arm. Keep your shoulder blade tucked down toward the middle of your back. 3. Hold for __________ seconds. 4. Slowly return to the starting position. Repeat __________ times. Complete this exercise __________ times a day. Internal rotation  1. Place your left / right hand behind your back, palm up. 2. Use your other hand to dangle an exercise band, a towel, or a similar object over your shoulder. Grasp the band with your left / right hand so you are holding on to both ends. 3. Gently pull up on the band until you feel a stretch in the front of your left / right shoulder. The movement of your arm toward the center of your body is called internal rotation. ? Avoid shrugging your shoulder while you raise your arm. Keep your shoulder blade tucked down toward the middle of your back. 4. Hold for __________ seconds. 5. Release the stretch by letting go of the band and lowering your hands. Repeat __________ times. Complete this exercise __________ times a day. Strengthening exercises External rotation  1. Sit in a stable chair without armrests. 2. Secure an exercise band to a stable object at elbow height on your left / right side. 3. Place a soft object, such as a folded towel or a small pillow, between your left / right upper arm and your body to move your elbow about 4 inches (10 cm) away  from your side. 4. Hold the end of the exercise band so it is tight and there is no slack. 5. Keeping your elbow pressed against the soft object, slowly move your forearm out, away from your abdomen (external rotation). Keep your body steady so only your forearm moves. 6. Hold for __________ seconds. 7. Slowly return to the starting position. Repeat __________ times. Complete this exercise __________ times a day. Shoulder abduction  1. Sit in a stable chair without armrests, or stand up. 2. Hold a __________ weight in your left / right hand, or hold an exercise band with both hands. 3. Start with your arms straight down and your left / right palm facing in, toward your body. 4. Slowly lift your left / right hand out to your side (abduction). Do not lift your hand above shoulder height unless your health care provider tells you that this is safe. ? Keep your arms straight. ? Avoid shrugging your shoulder while you   do this movement. Keep your shoulder blade tucked down toward the middle of your back. 5. Hold for __________ seconds. 6. Slowly lower your arm, and return to the starting position. Repeat __________ times. Complete this exercise __________ times a day. Shoulder extension 1. Sit in a stable chair without armrests, or stand up. 2. Secure an exercise band to a stable object in front of you so it is at shoulder height. 3. Hold one end of the exercise band in each hand. Your palms should face each other. 4. Straighten your elbows and lift your hands up to shoulder height. 5. Step back, away from the secured end of the exercise band, until the band is tight and there is no slack. 6. Squeeze your shoulder blades together as you pull your hands down to the sides of your thighs (extension). Stop when your hands are straight down by your sides. Do not let your hands go behind your body. 7. Hold for __________ seconds. 8. Slowly return to the starting position. Repeat __________ times.  Complete this exercise __________ times a day. Shoulder row 1. Sit in a stable chair without armrests, or stand up. 2. Secure an exercise band to a stable object in front of you so it is at waist height. 3. Hold one end of the exercise band in each hand. Position your palms so that your thumbs are facing the ceiling (neutral position). 4. Bend each of your elbows to a 90-degree angle (right angle) and keep your upper arms at your sides. 5. Step back until the band is tight and there is no slack. 6. Slowly pull your elbows back behind you. 7. Hold for __________ seconds. 8. Slowly return to the starting position. Repeat __________ times. Complete this exercise __________ times a day. Shoulder press-ups  1. Sit in a stable chair that has armrests. Sit upright, with your feet flat on the floor. 2. Put your hands on the armrests so your elbows are bent and your fingers are pointing forward. Your hands should be about even with the sides of your body. 3. Push down on the armrests and use your arms to lift yourself off the chair. Straighten your elbows and lift yourself up as much as you comfortably can. ? Move your shoulder blades down, and avoid letting your shoulders move up toward your ears. ? Keep your feet on the ground. As you get stronger, your feet should support less of your body weight as you lift yourself up. 4. Hold for __________ seconds. 5. Slowly lower yourself back into the chair. Repeat __________ times. Complete this exercise __________ times a day. Wall push-ups  1. Stand so you are facing a stable wall. Your feet should be about one arm-length away from the wall. 2. Lean forward and place your palms on the wall at shoulder height. 3. Keep your feet flat on the floor as you bend your elbows and lean forward toward the wall. 4. Hold for __________ seconds. 5. Straighten your elbows to push yourself back to the starting position. Repeat __________ times. Complete this exercise  __________ times a day. This information is not intended to replace advice given to you by your health care provider. Make sure you discuss any questions you have with your health care provider. Document Released: 08/15/2005 Document Revised: 01/23/2019 Document Reviewed: 10/31/2018 Elsevier Patient Education  2020 [employer]  

## 2019-05-28 ENCOUNTER — Encounter: Payer: Self-pay | Admitting: Internal Medicine

## 2019-06-03 DIAGNOSIS — R69 Illness, unspecified: Secondary | ICD-10-CM | POA: Diagnosis not present

## 2019-06-05 DIAGNOSIS — E0500 Thyrotoxicosis with diffuse goiter without thyrotoxic crisis or storm: Secondary | ICD-10-CM | POA: Diagnosis not present

## 2019-06-09 ENCOUNTER — Telehealth: Payer: Self-pay | Admitting: Internal Medicine

## 2019-06-09 NOTE — Telephone Encounter (Signed)
Patient called today to schedule her flu vaccine. She would like to know if she had her shingrix booster done and when   C/B #

## 2019-06-11 ENCOUNTER — Ambulatory Visit: Payer: Medicare HMO

## 2019-06-12 DIAGNOSIS — E0500 Thyrotoxicosis with diffuse goiter without thyrotoxic crisis or storm: Secondary | ICD-10-CM | POA: Diagnosis not present

## 2019-06-12 NOTE — Telephone Encounter (Signed)
She can have her Shingrix anytime.

## 2019-06-16 ENCOUNTER — Ambulatory Visit (INDEPENDENT_AMBULATORY_CARE_PROVIDER_SITE_OTHER): Payer: Medicare HMO

## 2019-06-16 DIAGNOSIS — Z23 Encounter for immunization: Secondary | ICD-10-CM

## 2019-06-17 ENCOUNTER — Encounter: Payer: Self-pay | Admitting: Internal Medicine

## 2019-06-17 DIAGNOSIS — J329 Chronic sinusitis, unspecified: Secondary | ICD-10-CM

## 2019-06-17 DIAGNOSIS — B9789 Other viral agents as the cause of diseases classified elsewhere: Secondary | ICD-10-CM

## 2019-06-17 MED ORDER — FLUTICASONE PROPIONATE 50 MCG/ACT NA SUSP: 2.0000 | Freq: Every day | NASAL | 1 refills | Status: DC | PRN

## 2019-06-19 ENCOUNTER — Encounter: Payer: Self-pay | Admitting: Internal Medicine

## 2019-06-19 DIAGNOSIS — B9789 Other viral agents as the cause of diseases classified elsewhere: Secondary | ICD-10-CM

## 2019-06-19 MED ORDER — FLUTICASONE PROPIONATE 50 MCG/ACT NA SUSP: 2.0000 | Freq: Every day | NASAL | 1 refills | Status: DC | PRN

## 2019-07-14 ENCOUNTER — Other Ambulatory Visit: Payer: Self-pay | Admitting: Internal Medicine

## 2019-07-14 DIAGNOSIS — B9789 Other viral agents as the cause of diseases classified elsewhere: Secondary | ICD-10-CM

## 2019-07-26 ENCOUNTER — Other Ambulatory Visit: Payer: Self-pay | Admitting: Internal Medicine

## 2019-07-26 DIAGNOSIS — B9789 Other viral agents as the cause of diseases classified elsewhere: Secondary | ICD-10-CM

## 2019-07-26 DIAGNOSIS — J329 Chronic sinusitis, unspecified: Secondary | ICD-10-CM

## 2019-09-28 ENCOUNTER — Encounter: Payer: Medicare HMO | Admitting: Internal Medicine

## 2019-10-01 DIAGNOSIS — R69 Illness, unspecified: Secondary | ICD-10-CM | POA: Diagnosis not present

## 2019-10-26 ENCOUNTER — Encounter: Payer: Medicare HMO | Admitting: Internal Medicine

## 2019-10-27 ENCOUNTER — Other Ambulatory Visit: Payer: Self-pay

## 2019-10-27 ENCOUNTER — Ambulatory Visit (INDEPENDENT_AMBULATORY_CARE_PROVIDER_SITE_OTHER): Payer: Medicare HMO | Admitting: Internal Medicine

## 2019-10-27 ENCOUNTER — Encounter: Payer: Self-pay | Admitting: Internal Medicine

## 2019-10-27 VITALS — BP 126/88 | HR 66 | Temp 96.5°F | Ht 60.0 in | Wt 167.0 lb

## 2019-10-27 DIAGNOSIS — E079 Disorder of thyroid, unspecified: Secondary | ICD-10-CM

## 2019-10-27 DIAGNOSIS — E7800 Pure hypercholesterolemia, unspecified: Secondary | ICD-10-CM | POA: Diagnosis not present

## 2019-10-27 DIAGNOSIS — Z0000 Encounter for general adult medical examination without abnormal findings: Secondary | ICD-10-CM

## 2019-10-27 DIAGNOSIS — M160 Bilateral primary osteoarthritis of hip: Secondary | ICD-10-CM

## 2019-10-27 LAB — CBC
HCT: 43.2 % (ref 36.0–46.0)
Hemoglobin: 14.7 g/dL (ref 12.0–15.0)
MCHC: 34.1 g/dL (ref 30.0–36.0)
MCV: 90.7 fl (ref 78.0–100.0)
Platelets: 210 10*3/uL (ref 150.0–400.0)
RBC: 4.76 Mil/uL (ref 3.87–5.11)
RDW: 12.2 % (ref 11.5–15.5)
WBC: 5.6 10*3/uL (ref 4.0–10.5)

## 2019-10-27 LAB — COMPREHENSIVE METABOLIC PANEL
ALT: 20 U/L (ref 0–35)
AST: 13 U/L (ref 0–37)
Albumin: 4.3 g/dL (ref 3.5–5.2)
Alkaline Phosphatase: 65 U/L (ref 39–117)
BUN: 21 mg/dL (ref 6–23)
CO2: 30 mEq/L (ref 19–32)
Calcium: 9.7 mg/dL (ref 8.4–10.5)
Chloride: 103 mEq/L (ref 96–112)
Creatinine, Ser: 0.9 mg/dL (ref 0.40–1.20)
GFR: 62.31 mL/min (ref >60.00)
Glucose, Bld: 90 mg/dL (ref 70–99)
Potassium: 4.5 mEq/L (ref 3.5–5.1)
Sodium: 139 mEq/L (ref 135–145)
Total Bilirubin: 0.6 mg/dL (ref 0.2–1.2)
Total Protein: 6.5 g/dL (ref 6.0–8.3)

## 2019-10-27 LAB — VITAMIN D 25 HYDROXY (VIT D DEFICIENCY, FRACTURES): VITD: 39.78 ng/mL (ref 30.00–100.00)

## 2019-10-27 LAB — LIPID PANEL
Cholesterol: 208 mg/dL — ABNORMAL HIGH (ref 0–200)
HDL: 46.1 mg/dL (ref >39.00)
NonHDL: 161.42
Total CHOL/HDL Ratio: 5
Triglycerides: 244 mg/dL — ABNORMAL HIGH (ref 0.0–149.0)
VLDL: 48.8 mg/dL — ABNORMAL HIGH (ref 0.0–40.0)

## 2019-10-27 LAB — LDL CHOLESTEROL, DIRECT: Direct LDL: 125 mg/dL

## 2019-10-27 MED ORDER — ZOSTER VAC RECOMB ADJUVANTED 50 MCG/0.5ML IM SUSR: 0.5000 mL | Freq: Once | INTRAMUSCULAR | 0 refills | Status: AC

## 2019-10-27 NOTE — Assessment & Plan Note (Signed)
CMET and lipid profile today Encouraged her to consume a balanced diet and exercise regimen Advised her to consume a low fat diet

## 2019-10-27 NOTE — Patient Instructions (Signed)

## 2019-10-27 NOTE — Assessment & Plan Note (Signed)
She is considering switching from Dr. Gabriel Carina Continue Tapazole for now

## 2019-10-27 NOTE — Assessment & Plan Note (Signed)
Encouraged regular physical activity Follow up with Dr. Rudene Christians, as needed

## 2019-10-27 NOTE — Progress Notes (Signed)
HPI:  Pt presents to the clinic today for her annual subsequent Medicare Wellness Exam. She is also due to follow up chronic conditions.  OA: Mainly in her hips. She has had a left hip replacement. She takes as needed with good relief of symptoms. She follows with Dr. Rudene Christians.  HLD: Her last LDL was 138, triglycerides 257, 08/2018. She denies myalgias on Lovastatin. She tries to consume a low fat diet.  Hyperthyroidism: She is taking Methimazole as prescribed. She follows with Dr. Gabriel Carina.  Past Medical History:  Diagnosis Date  . Arthritis   . Diverticulosis of colon   . Hyperlipidemia   . Liver cyst   . Thyroid disease     Current Outpatient Medications  Medication Sig Dispense Refill  . aspirin EC 81 MG tablet Take by mouth.    . Calcium Carb-Cholecalciferol (CALCIUM 500 +D) 500-400 MG-UNIT TABS Take 1 capsule by mouth at bedtime.      . cyclobenzaprine (FLEXERIL) 5 MG tablet Take 1 tablet (5 mg total) by mouth at bedtime as needed for muscle spasms. 20 tablet 0  . fluticasone (FLONASE) 50 MCG/ACT nasal spray PLACE 2 SPRAYS INTO BOTH NOSTRILS DAILY AS NEEDED. 48 mL 0  . loratadine (CLARITIN) 10 MG tablet Take 10 mg by mouth daily as needed for allergies.    Marland Kitchen lovastatin (MEVACOR) 40 MG tablet Take 1 tablet (40 mg total) by mouth at bedtime. 30 tablet 0  . magnesium gluconate (MAGONATE) 500 MG tablet Take 500 mg by mouth daily.    . methimazole (TAPAZOLE) 5 MG tablet Take 5 mg by mouth daily.    . Milk Thistle Extract 175 MG TABS Take by mouth.    . predniSONE (DELTASONE) 10 MG tablet Take 6 tabs day 1, 5 tabs day 2, 4 tabs day 3, 3 tabs day 4, 2 tabs day 5, 1 tab day 6 21 tablet 0   No current facility-administered medications for this visit.    Allergies  Allergen Reactions  . Meperidine Nausea And Vomiting  . Meperidine Hcl     REACTION: vomiting  . Sulfamethoxazole-Trimethoprim Hives and Rash    REACTION: hives REACTION: hives    Family History  Problem Relation Age of  Onset  . Hyperlipidemia Mother   . Hypertension Mother   . Dementia Father   . Breast cancer Neg Hx     Social History   Socioeconomic History  . Marital status: Married    Spouse name: Not on file  . Number of children: Not on file  . Years of education: Not on file  . Highest education level: Not on file  Occupational History  . Not on file  Tobacco Use  . Smoking status: Never Smoker  . Smokeless tobacco: Never Used  Substance and Sexual Activity  . Alcohol use: Yes    Comment: occassional  . Drug use: No  . Sexual activity: Not on file  Other Topics Concern  . Not on file  Social History Narrative   Recently moved here from [place]      Son is internist hospitalist      Full code   Social Determinants of Health   Financial Resource Strain:   . Difficulty of Paying Living Expenses: Not on file  Food Insecurity:   . Worried About Charity fundraiser in the Last Year: Not on file  . Ran Out of Food in the Last Year: Not on file  Transportation Needs:   . Lack of Transportation (Medical):  Not on file  . Lack of Transportation (Non-Medical): Not on file  Physical Activity:   . Days of Exercise per Week: Not on file  . Minutes of Exercise per Session: Not on file  Stress:   . Feeling of Stress : Not on file  Social Connections:   . Frequency of Communication with Friends and Family: Not on file  . Frequency of Social Gatherings with Friends and Family: Not on file  . Attends Religious Services: Not on file  . Active Member of Clubs or Organizations: Not on file  . Attends Archivist Meetings: Not on file  . Marital Status: Not on file  Intimate Partner Violence:   . Fear of Current or Ex-Partner: Not on file  . Emotionally Abused: Not on file  . Physically Abused: Not on file  . Sexually Abused: Not on file    Hospitiliaztions: None  Health Maintenance:    Flu: 06/2019  Tetanus: 07/2014  Pneumovax: 08/2018  Prevnar: 07/2017  Zostavax:  07/2014  Shingrix: negative  Mammogram: 04/2019  Pap Smear: 07/2016  Bone Density: 08/2017  Colon Screening: Cologuard, 08/2017  Eye Doctor: annually  Dental Exam: biannually   Providers:   PCP: Webb Silversmith, NP  Orthopedist: Dr. Rudene Christians  Dermatologist: [facility]  Endocrinologist: Dr. Gabriel Carina   I have personally reviewed and have noted:  1. The patient's medical and social history 2. Their use of alcohol, tobacco or illicit drugs 3. Their current medications and supplements 4. The patient's functional ability including ADL's, fall risks, home safety risks and hearing or visual impairment. 5. Diet and physical activities 6. Evidence for depression or mood disorder  Subjective:   Review of Systems:   Constitutional: Denies fever, malaise, fatigue, headache or abrupt weight changes.  HEENT: Denies eye pain, eye redness, ear pain, ringing in the ears, wax buildup, runny nose, nasal congestion, bloody nose, or sore throat. Respiratory: Denies difficulty breathing, shortness of breath, cough or sputum production.   Cardiovascular: Denies chest pain, chest tightness, palpitations or swelling in the hands or feet.  Gastrointestinal: Denies abdominal pain, bloating, constipation, diarrhea or blood in the stool.  GU: Denies urgency, frequency, pain with urination, burning sensation, blood in urine, odor or discharge. Musculoskeletal: Pt reports intermittent hip pain, tightness of neck muscles on the left. Denies decrease in range of motion, difficulty with gait, muscle pain or joint pain and swelling.  Skin: Denies redness, rashes, lesions or ulcercations.  Neurological: Denies dizziness, difficulty with memory, difficulty with speech or problems with balance and coordination.  Psych: Denies anxiety, depression, SI/HI.  No other specific complaints in a complete review of systems (except as listed in HPI above).  Objective:  PE:   BP 126/88   Pulse 66   Temp (!) 96.5 F  (35.8 C) (Temporal)   Ht 5' (1.524 m)   Wt 167 lb (75.8 kg)   SpO2 98%   BMI 32.61 kg/m   Wt Readings from Last 3 Encounters:  05/27/19 166 lb (75.3 kg)  08/28/18 177 lb (80.3 kg)  09/17/17 177 lb (80.3 kg)    General: Appears her stated age, well developed, well nourished in NAD. Skin: Warm, dry and intact. No rashes noted. HEENT: Head: normal shape and size; Eyes: sclera white, no icterus, conjunctiva pink, PERRLA and EOMs intact;  Neck: Neck supple, trachea midline. No masses, lumps or thyromegaly present.  Cardiovascular: Normal rate and rhythm. S1,S2 noted.  No murmur, rubs or gallops noted. No JVD or BLE edema.  No carotid bruits noted. Pulmonary/Chest: Normal effort and positive vesicular breath sounds. No respiratory distress. No wheezes, rales or ronchi noted.  Abdomen: Soft and nontender. Normal bowel sounds. No distention or masses noted. Liver, spleen and kidneys non palpable. Musculoskeletal:  Strength 5/5 BUE/BLE. No difficulty with gait.  Neurological: Alert and oriented. Cranial nerves II-XII grossly intact. Coordination normal.  Psychiatric: Mood and affect normal. Behavior is normal. Judgment and thought content normal.     BMET    Component Value Date/Time   NA 140 08/28/2018 1448   K 3.9 08/28/2018 1448   CL 103 08/28/2018 1448   CO2 30 08/28/2018 1448   GLUCOSE 89 08/28/2018 1448   BUN 20 08/28/2018 1448   CREATININE 0.98 08/28/2018 1448   CALCIUM 9.8 08/28/2018 1448   GFRNONAA >60 08/14/2016 1053   GFRAA >60 08/14/2016 1053    Lipid Panel     Component Value Date/Time   CHOL 207 (H) 08/28/2018 1448   TRIG 257.0 (H) 08/28/2018 1448   HDL 45.10 08/28/2018 1448   CHOLHDL 5 08/28/2018 1448   VLDL 51.4 (H) 08/28/2018 1448   LDLCALC 123 (H) 07/13/2014 0855    CBC    Component Value Date/Time   WBC 7.0 08/28/2018 1448   RBC 4.88 08/28/2018 1448   HGB 15.3 (H) 08/28/2018 1448   HCT 44.5 08/28/2018 1448   PLT 232.0 08/28/2018 1448   MCV 91.2  08/28/2018 1448   MCH 31.4 08/14/2016 1053   MCHC 34.5 08/28/2018 1448   RDW 12.4 08/28/2018 1448   LYMPHSABS 1.9 07/25/2017 0821   MONOABS 0.4 07/25/2017 0821   EOSABS 0.1 07/25/2017 0821   BASOSABS 0.1 07/25/2017 0821    Hgb A1C No results found for: HGBA1C    Assessment and Plan:   Medicare Annual Wellness Visit:  Diet: She does eat meat. She consumes fruits and veggies. She tries to avoid fried foods. She drinks mostly lemon water. Physical activity: Golfer, walking daily Depression/mood screen: PHQ 9 score of 0 Hearing: Intact to whispered voice Visual acuity: Grossly normal, performs annual eye exam  ADLs: Capable Fall risk: None Home safety: Good Cognitive evaluation: Intact to orientation, naming, recall and repetition EOL planning: Adv directives, full code/ I agree  Preventative Medicine:  Flu, tetanus, pneumovax, prevnar and zostovax UTD. RX for shingrix vaccine given to pt. Pap smear, mammogram and bone density UTD. Cologuard due 08/2020. Encouraged her to consume a balanced diet and exercise regimen. Advised her to see an eye doctor and dentist annually. Will check CBC, CMET, LIpid and Vit D today. Due dates for screening exam given to patient as part of her AVS.  Next appointment: 1 year, Medicare Wellness Exam  Webb Silversmith, NP  This visit occurred during the SARS-CoV-2 public health emergency.  Safety protocols were in place, including screening questions prior to the visit, additional usage of staff PPE, and extensive cleaning of exam room while observing appropriate contact time as indicated for disinfecting solutions.    Webb Silversmith, NP

## 2019-10-30 ENCOUNTER — Encounter: Payer: Self-pay | Admitting: Internal Medicine

## 2019-11-04 ENCOUNTER — Ambulatory Visit: Payer: Medicare Other | Attending: Internal Medicine

## 2019-11-04 DIAGNOSIS — Z23 Encounter for immunization: Secondary | ICD-10-CM

## 2019-11-04 NOTE — Progress Notes (Signed)
   Covid-19 Vaccination Clinic  Name:  Julia Moreno    MRN: [id number] DOB: [birth year]  11/04/2019  Julia Moreno was observed post Covid-19 immunization for 15 minutes without incidence. She was provided with Vaccine Information Sheet and instruction to access the V-Safe system.   Julia Moreno was instructed to call 911 with any severe reactions post vaccine: Marland Kitchen Difficulty breathing  . Swelling of your face and throat  . A fast heartbeat  . A bad rash all over your body  . Dizziness and weakness    Immunizations Administered    Name Date Dose VIS Date Route   Pfizer COVID-19 Vaccine 11/04/2019 11:29 AM 0.3 mL 09/25/2019 Intramuscular   Manufacturer: [place]   Lot: [id number]   [place]: [id number]

## 2019-11-25 ENCOUNTER — Ambulatory Visit: Payer: Medicare HMO | Attending: Internal Medicine

## 2019-11-25 DIAGNOSIS — Z23 Encounter for immunization: Secondary | ICD-10-CM | POA: Insufficient documentation

## 2019-11-25 NOTE — Progress Notes (Signed)
   Covid-19 Vaccination Clinic  Name:  Julia Moreno    MRN: [id number] DOB: [birth year]  11/25/2019  Julia Moreno was observed post Covid-19 immunization for 15 minutes without incidence. She was provided with Vaccine Information Sheet and instruction to access the V-Safe system.   Julia Moreno was instructed to call 911 with any severe reactions post vaccine: Marland Kitchen Difficulty breathing  . Swelling of your face and throat  . A fast heartbeat  . A bad rash all over your body  . Dizziness and weakness    Immunizations Administered    Name Date Dose VIS Date Route   Pfizer COVID-19 Vaccine 11/25/2019  9:21 AM 0.3 mL 09/25/2019 Intramuscular   Manufacturer: [place]   Lot: [id number]   [place]: [id number]

## 2019-11-30 DIAGNOSIS — R69 Illness, unspecified: Secondary | ICD-10-CM | POA: Diagnosis not present

## 2019-12-02 DIAGNOSIS — R69 Illness, unspecified: Secondary | ICD-10-CM | POA: Diagnosis not present

## 2020-01-01 DIAGNOSIS — E0500 Thyrotoxicosis with diffuse goiter without thyrotoxic crisis or storm: Secondary | ICD-10-CM | POA: Diagnosis not present

## 2020-01-04 DIAGNOSIS — R69 Illness, unspecified: Secondary | ICD-10-CM | POA: Diagnosis not present

## 2020-01-08 DIAGNOSIS — E0500 Thyrotoxicosis with diffuse goiter without thyrotoxic crisis or storm: Secondary | ICD-10-CM | POA: Diagnosis not present

## 2020-02-02 ENCOUNTER — Other Ambulatory Visit: Payer: Self-pay | Admitting: Internal Medicine

## 2020-02-02 DIAGNOSIS — Z1231 Encounter for screening mammogram for malignant neoplasm of breast: Secondary | ICD-10-CM

## 2020-04-06 DIAGNOSIS — H5203 Hypermetropia, bilateral: Secondary | ICD-10-CM | POA: Diagnosis not present

## 2020-04-06 DIAGNOSIS — H524 Presbyopia: Secondary | ICD-10-CM | POA: Diagnosis not present

## 2020-04-06 DIAGNOSIS — H25813 Combined forms of age-related cataract, bilateral: Secondary | ICD-10-CM | POA: Diagnosis not present

## 2020-04-13 DIAGNOSIS — R69 Illness, unspecified: Secondary | ICD-10-CM | POA: Diagnosis not present

## 2020-04-14 DIAGNOSIS — Q828 Other specified congenital malformations of skin: Secondary | ICD-10-CM | POA: Diagnosis not present

## 2020-04-14 DIAGNOSIS — L821 Other seborrheic keratosis: Secondary | ICD-10-CM | POA: Diagnosis not present

## 2020-04-14 DIAGNOSIS — D229 Melanocytic nevi, unspecified: Secondary | ICD-10-CM | POA: Diagnosis not present

## 2020-04-14 DIAGNOSIS — D1801 Hemangioma of skin and subcutaneous tissue: Secondary | ICD-10-CM | POA: Diagnosis not present

## 2020-04-14 DIAGNOSIS — L720 Epidermal cyst: Secondary | ICD-10-CM | POA: Diagnosis not present

## 2020-04-14 DIAGNOSIS — D225 Melanocytic nevi of trunk: Secondary | ICD-10-CM | POA: Diagnosis not present

## 2020-04-25 ENCOUNTER — Ambulatory Visit
Admission: RE | Admit: 2020-04-25 | Discharge: 2020-04-25 | Disposition: A | Discharge status: Home / Self Care | Payer: Medicare HMO | Source: Ambulatory Visit | Attending: Internal Medicine | Admitting: Internal Medicine

## 2020-04-25 DIAGNOSIS — Z1231 Encounter for screening mammogram for malignant neoplasm of breast: Secondary | ICD-10-CM | POA: Insufficient documentation

## 2020-05-30 ENCOUNTER — Other Ambulatory Visit: Payer: Self-pay | Admitting: Internal Medicine

## 2020-05-30 DIAGNOSIS — B9789 Other viral agents as the cause of diseases classified elsewhere: Secondary | ICD-10-CM

## 2020-05-30 DIAGNOSIS — J329 Chronic sinusitis, unspecified: Secondary | ICD-10-CM

## 2020-07-16 ENCOUNTER — Encounter: Payer: Self-pay | Admitting: Internal Medicine

## 2020-07-18 DIAGNOSIS — E0500 Thyrotoxicosis with diffuse goiter without thyrotoxic crisis or storm: Secondary | ICD-10-CM | POA: Diagnosis not present

## 2020-07-26 DIAGNOSIS — R69 Illness, unspecified: Secondary | ICD-10-CM | POA: Diagnosis not present

## 2020-07-27 DIAGNOSIS — E0500 Thyrotoxicosis with diffuse goiter without thyrotoxic crisis or storm: Secondary | ICD-10-CM | POA: Diagnosis not present

## 2020-10-28 ENCOUNTER — Telehealth: Payer: Self-pay | Admitting: Internal Medicine

## 2020-10-28 DIAGNOSIS — B9789 Other viral agents as the cause of diseases classified elsewhere: Secondary | ICD-10-CM

## 2020-10-28 MED ORDER — FLUTICASONE PROPIONATE 50 MCG/ACT NA SUSP: NASAL | 0 refills | Status: DC

## 2020-10-28 NOTE — Telephone Encounter (Signed)
Patient had an appointment for Monday, but it was cancelled due to snow.  Patient is out of her Flonase.  Patient's requesting a refill be sent to [facility].  Patient rescheduled her cpx to 11/28/20.

## 2020-10-28 NOTE — Telephone Encounter (Signed)
Rx sent through e-scribe  

## 2020-10-31 ENCOUNTER — Encounter: Payer: Medicare HMO | Admitting: Internal Medicine

## 2020-11-28 ENCOUNTER — Encounter: Payer: Self-pay | Admitting: Internal Medicine

## 2020-11-28 ENCOUNTER — Ambulatory Visit (INDEPENDENT_AMBULATORY_CARE_PROVIDER_SITE_OTHER): Payer: Medicare HMO | Admitting: Internal Medicine

## 2020-11-28 ENCOUNTER — Other Ambulatory Visit: Payer: Self-pay

## 2020-11-28 VITALS — BP 120/70 | HR 64 | Temp 97.0°F | Ht 59.75 in | Wt 161.0 lb

## 2020-11-28 DIAGNOSIS — M8949 Other hypertrophic osteoarthropathy, multiple sites: Secondary | ICD-10-CM | POA: Diagnosis not present

## 2020-11-28 DIAGNOSIS — E079 Disorder of thyroid, unspecified: Secondary | ICD-10-CM | POA: Diagnosis not present

## 2020-11-28 DIAGNOSIS — E7800 Pure hypercholesterolemia, unspecified: Secondary | ICD-10-CM

## 2020-11-28 DIAGNOSIS — M159 Polyosteoarthritis, unspecified: Secondary | ICD-10-CM

## 2020-11-28 DIAGNOSIS — Z0000 Encounter for general adult medical examination without abnormal findings: Secondary | ICD-10-CM

## 2020-11-28 NOTE — Progress Notes (Signed)
HPI:  Patient presents to the clinic today for her subsequent annual Medicare wellness exam.  She is also due to follow-up chronic conditions.  OA: Mainly in her hips.  She is status post left hip replacement, will need the right done eventually.  She follows with Dr. Rudene Christians.  HLD: Her last LDL was125, triglycerides 244, 10/2019.  She denies myalgias on Lovastatin.  She tries to consume a low-fat diet.  Hypothyroidism: Managed on Methimazole as prescribed.  She follows with Dr. Gabriel Carina.  Past Medical History:  Diagnosis Date  . Arthritis   . Diverticulosis of colon   . Hyperlipidemia   . Liver cyst   . Thyroid disease     Current Outpatient Medications  Medication Sig Dispense Refill  . aspirin EC 81 MG tablet Take by mouth.    . Calcium Carb-Cholecalciferol (CALCIUM 500 +D) 500-400 MG-UNIT TABS Take 1 capsule by mouth at bedtime.      . fluticasone (FLONASE) 50 MCG/ACT nasal spray INHALE 2 SPRAYS INTO BOTH NOSTRILS DAILY AS NEEDED. 16 g 0  . loratadine (CLARITIN) 10 MG tablet Take 10 mg by mouth daily as needed for allergies.    Marland Kitchen lovastatin (MEVACOR) 40 MG tablet Take 1 tablet (40 mg total) by mouth at bedtime. 30 tablet 0  . magnesium gluconate (MAGONATE) 500 MG tablet Take 500 mg by mouth daily.    . methimazole (TAPAZOLE) 5 MG tablet Take 5 mg by mouth daily.    . Milk Thistle Extract 175 MG TABS Take by mouth.     No current facility-administered medications for this visit.    Allergies  Allergen Reactions  . Meperidine Nausea And Vomiting  . Meperidine Hcl     REACTION: vomiting  . Sulfamethoxazole-Trimethoprim Hives and Rash    REACTION: hives REACTION: hives    Family History  Problem Relation Age of Onset  . Hyperlipidemia Mother   . Hypertension Mother   . Dementia Father   . Breast cancer Neg Hx     Social History   Socioeconomic History  . Marital status: Married    Spouse name: Not on file  . Number of children: Not on file  . Years of education: Not  on file  . Highest education level: Not on file  Occupational History  . Not on file  Tobacco Use  . Smoking status: Never Smoker  . Smokeless tobacco: Never Used  Substance and Sexual Activity  . Alcohol use: Yes    Comment: occassional  . Drug use: No  . Sexual activity: Not on file  Other Topics Concern  . Not on file  Social History Narrative   Recently moved here from [place]      Son is internist hospitalist      Full code   Social Determinants of Health   Financial Resource Strain: Not on file  Food Insecurity: Not on file  Transportation Needs: Not on file  Physical Activity: Not on file  Stress: Not on file  Social Connections: Not on file  Intimate Partner Violence: Not on file    Hospitiliaztions: None  Health Maintenance:    Flu: 10/2020.  Tetanus: 07/2014  Pneumovax: 08/2018  Prevnar: 07/2017  Zostavax:07/2014  Shingrix: 2021  Covid: [place] x3  Mammogram: 04/2020  Pap Smear: 07/2016  Bone Density: 08/2017  Colon Screening: 08/2017, Cologuard  Eye Doctor: Annually  Dental Exam: Biannually   Providers:   PCP: Webb Silversmith, NP   Orthopedist: Dr. Rudene Christians  Endocrinologist: Dr. Gabriel Carina  I have personally reviewed and have noted:  1. The patient's medical and social history 2. Their use of alcohol, tobacco or illicit drugs 3. Their current medications and supplements 4. The patient's functional ability including ADL's, fall risks, home safety risks and  hearing or visual impairment. 5. Diet and physical activities 6. Evidence for depression or mood disorder  Subjective:   Review of Systems:   Constitutional: Denies fever, malaise, fatigue, headache or abrupt weight changes.  HEENT: Denies eye pain, eye redness, ear pain, ringing in the ears, wax buildup, runny nose, nasal congestion, bloody nose, or sore throat. Respiratory: Denies difficulty breathing, shortness of breath, cough or sputum production.   Cardiovascular: Denies chest pain, chest  tightness, palpitations or swelling in the hands or feet.  Gastrointestinal: Denies abdominal pain, bloating, constipation, diarrhea or blood in the stool.  GU: Denies urgency, frequency, pain with urination, burning sensation, blood in urine, odor or discharge. Musculoskeletal: Patient reports intermittent hip pain.  Denies decrease in range of motion, difficulty with gait, muscle pain or joint swelling.  Skin: Denies redness, rashes, lesions or ulcercations.  Neurological: Denies dizziness, difficulty with memory, difficulty with speech or problems with balance and coordination.  Psych: Denies anxiety, depression, SI/HI.  No other specific complaints in a complete review of systems (except as listed in HPI above).  Objective:  PE:   BP 120/70   Pulse 64   Temp (!) 97 F (36.1 C) (Temporal)   Ht 4' 11.75" (1.518 m)   Wt 161 lb (73 kg)   SpO2 98%   BMI 31.71 kg/m   Wt Readings from Last 3 Encounters:  10/27/19 167 lb (75.8 kg)  05/27/19 166 lb (75.3 kg)  08/28/18 177 lb (80.3 kg)    General: Appears her stated age, well developed, well nourished in NAD. Skin: Warm, dry and intact. No rashes, lesions or ulcerations noted. HEENT: Head: normal shape and size; Eyes: sclera white, no icterus, conjunctiva pink, PERRLA and EOMs intact;  Neck: Neck supple, trachea midline. No masses, lumps or thyromegaly present.  Cardiovascular: Normal rate and rhythm. S1,S2 noted.  No murmur, rubs or gallops noted. No JVD or BLE edema. No carotid bruits noted. Pulmonary/Chest: Normal effort and positive vesicular breath sounds. No respiratory distress. No wheezes, rales or ronchi noted.  Abdomen: Soft and nontender. Normal bowel sounds. No distention or masses noted. Liver, spleen and kidneys non palpable. Musculoskeletal:  Strength 5/5 BUE/BLE. No difficulty with gait. Neurological: Alert and oriented. Cranial nerves II-XII grossly intact. Coordination normal.  Psychiatric: Mood and affect normal.  Behavior is normal. Judgment and thought content normal.   BMET    Component Value Date/Time   NA 139 10/27/2019 1118   K 4.5 10/27/2019 1118   CL 103 10/27/2019 1118   CO2 30 10/27/2019 1118   GLUCOSE 90 10/27/2019 1118   BUN 21 10/27/2019 1118   CREATININE 0.90 10/27/2019 1118   CALCIUM 9.7 10/27/2019 1118   GFRNONAA >60 08/14/2016 1053   GFRAA >60 08/14/2016 1053    Lipid Panel     Component Value Date/Time   CHOL 208 (H) 10/27/2019 1118   TRIG 244.0 (H) 10/27/2019 1118   HDL 46.10 10/27/2019 1118   CHOLHDL 5 10/27/2019 1118   VLDL 48.8 (H) 10/27/2019 1118   LDLCALC 123 (H) 07/13/2014 0855    CBC    Component Value Date/Time   WBC 5.6 10/27/2019 1118   RBC 4.76 10/27/2019 1118   HGB 14.7 10/27/2019 1118   HCT 43.2  10/27/2019 1118   PLT 210.0 10/27/2019 1118   MCV 90.7 10/27/2019 1118   MCH 31.4 08/14/2016 1053   MCHC 34.1 10/27/2019 1118   RDW 12.2 10/27/2019 1118   LYMPHSABS 1.9 07/25/2017 0821   MONOABS 0.4 07/25/2017 0821   EOSABS 0.1 07/25/2017 0821   BASOSABS 0.1 07/25/2017 0821    Hgb A1C No results found for: HGBA1C    Assessment and Plan:   Medicare Annual Wellness Visit:  Diet: She does eat meat.  She consumes fruits and veggies daily.  She tries to avoid fried foods.  She drinks mostly water. Physical activity: Golfing, walking Depression/mood screen: Negative, PHQ 9 score of 0 Hearing: Intact to whispered voice Visual acuity: Grossly normal, performs annual eye exam  ADLs: Capable Fall risk: None Home safety: Good Cognitive evaluation: Intact to orientation, naming, recall and repetition EOL planning: Adv directives, full code/ I agree  Preventative Medicine: Flu, tetanus, Pneumovax, Prevnar, Zostavax, Shingrix and Covid UTD.  She no longer wants to screen for cervical cancer.  Mammograms, bone density UTD.  She set up for Cologuard today.  Encouraged her to consume a balanced diet and exercise regimen.  Advised her to see an eye doctor  and dentist annually.  Will check CBC, c-Met, TSH, free T4 and lipid profile today.  Due dates for screening exams given to patient as part of her AVS.   Next appointment: 1 year, Medicare wellness exam.   Webb Silversmith, NP This visit occurred during the SARS-CoV-2 public health emergency.  Safety protocols were in place, including screening questions prior to the visit, additional usage of staff PPE, and extensive cleaning of exam room while observing appropriate contact time as indicated for disinfecting solutions.

## 2020-11-29 ENCOUNTER — Encounter: Payer: Self-pay | Admitting: Internal Medicine

## 2020-11-29 NOTE — Assessment & Plan Note (Signed)
C-Met and lipid profile today Encouraged her to consume a low-fat diet Continue lovastatin

## 2020-11-29 NOTE — Assessment & Plan Note (Signed)
She will follow up with Dr. Rudene Christians as needed

## 2020-11-29 NOTE — Assessment & Plan Note (Signed)
Will hold Methimazole Plan to repeat TSH and free T4 in 1 month, lab only If normal, she would prefer not to see endocrinology

## 2020-11-29 NOTE — Patient Instructions (Signed)

## 2020-12-02 ENCOUNTER — Other Ambulatory Visit: Payer: Medicare HMO

## 2020-12-30 ENCOUNTER — Other Ambulatory Visit: Payer: Self-pay

## 2020-12-30 ENCOUNTER — Other Ambulatory Visit (INDEPENDENT_AMBULATORY_CARE_PROVIDER_SITE_OTHER): Payer: Medicare HMO

## 2020-12-30 DIAGNOSIS — E079 Disorder of thyroid, unspecified: Secondary | ICD-10-CM | POA: Diagnosis not present

## 2020-12-30 DIAGNOSIS — E7800 Pure hypercholesterolemia, unspecified: Secondary | ICD-10-CM | POA: Diagnosis not present

## 2020-12-30 LAB — LIPID PANEL
Cholesterol: 187 mg/dL (ref 0–200)
HDL: 48.4 mg/dL (ref >39.00)
LDL Cholesterol: 102 mg/dL — ABNORMAL HIGH (ref 0–99)
NonHDL: 138.29
Total CHOL/HDL Ratio: 4
Triglycerides: 179 mg/dL — ABNORMAL HIGH (ref 0.0–149.0)
VLDL: 35.8 mg/dL (ref 0.0–40.0)

## 2020-12-30 LAB — COMPREHENSIVE METABOLIC PANEL
ALT: 20 U/L (ref 0–35)
AST: 14 U/L (ref 0–37)
Albumin: 4.1 g/dL (ref 3.5–5.2)
Alkaline Phosphatase: 68 U/L (ref 39–117)
BUN: 18 mg/dL (ref 6–23)
CO2: 29 mEq/L (ref 19–32)
Calcium: 9.5 mg/dL (ref 8.4–10.5)
Chloride: 104 mEq/L (ref 96–112)
Creatinine, Ser: 0.85 mg/dL (ref 0.40–1.20)
GFR: 70.14 mL/min (ref >60.00)
Glucose, Bld: 92 mg/dL (ref 70–99)
Potassium: 4.3 mEq/L (ref 3.5–5.1)
Sodium: 141 mEq/L (ref 135–145)
Total Bilirubin: 0.9 mg/dL (ref 0.2–1.2)
Total Protein: 6.3 g/dL (ref 6.0–8.3)

## 2020-12-30 LAB — CBC
HCT: 41.6 % (ref 36.0–46.0)
Hemoglobin: 14.5 g/dL (ref 12.0–15.0)
MCHC: 34.9 g/dL (ref 30.0–36.0)
MCV: 89.2 fl (ref 78.0–100.0)
Platelets: 192 10*3/uL (ref 150.0–400.0)
RBC: 4.66 Mil/uL (ref 3.87–5.11)
RDW: 12.6 % (ref 11.5–15.5)
WBC: 5 10*3/uL (ref 4.0–10.5)

## 2020-12-30 LAB — TSH: TSH: 1.92 u[IU]/mL (ref 0.35–4.50)

## 2020-12-30 LAB — T4, FREE: Free T4: 0.89 ng/dL (ref 0.60–1.60)

## 2020-12-31 ENCOUNTER — Encounter: Payer: Self-pay | Admitting: Internal Medicine

## 2021-01-15 IMAGING — MG DIGITAL SCREENING BILATERAL MAMMOGRAM WITH TOMO AND CAD
8 series · 8 of 24 positions shown · non-contrast
Comparison: Previous exam(s).

CLINICAL DATA: Screening.

EXAM:
DIGITAL SCREENING BILATERAL MAMMOGRAM WITH TOMO AND CAD

[R MLO synth-2D]
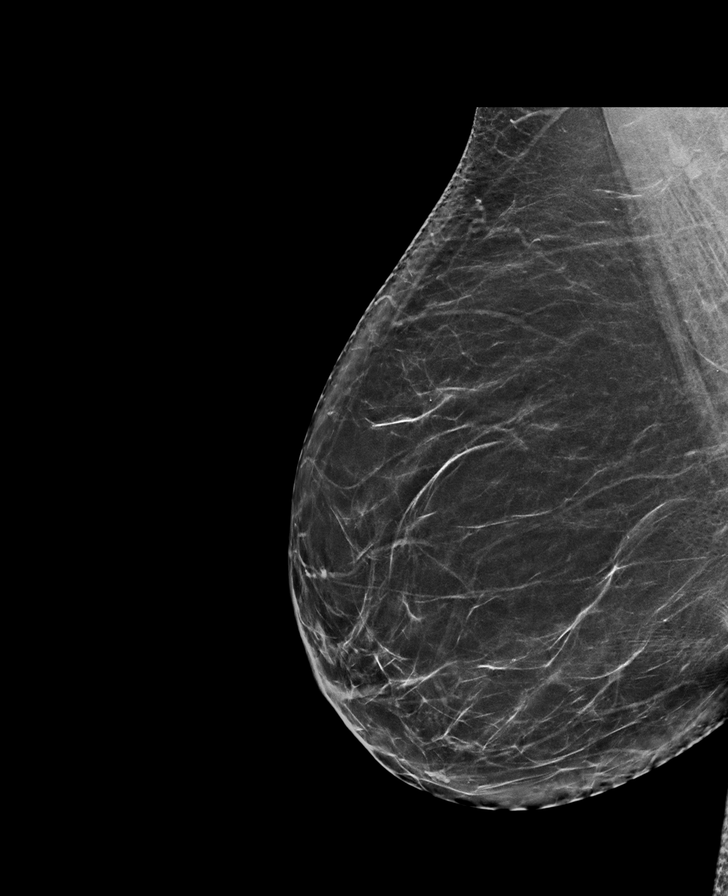

[L MLO synth-2D]
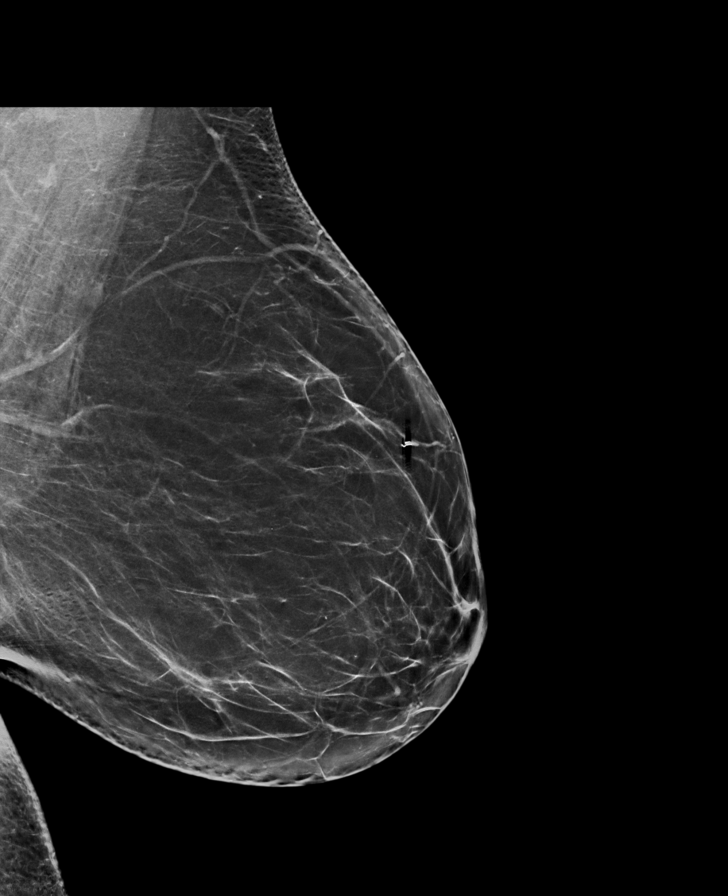

[R CC synth-2D]
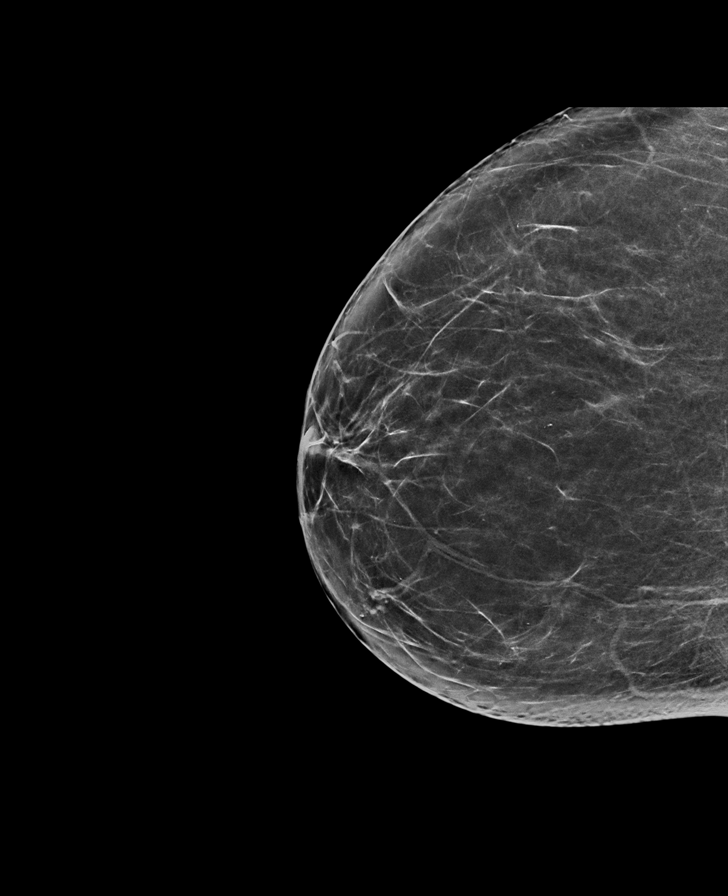

[L CC synth-2D]
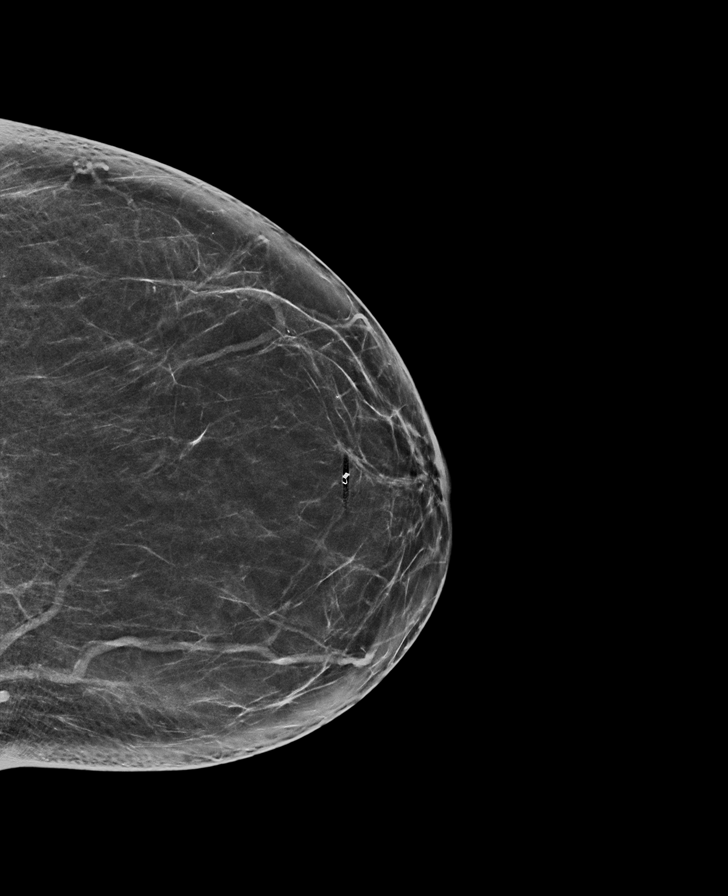

[R MLO tomo · tomo slice 39/77.0]
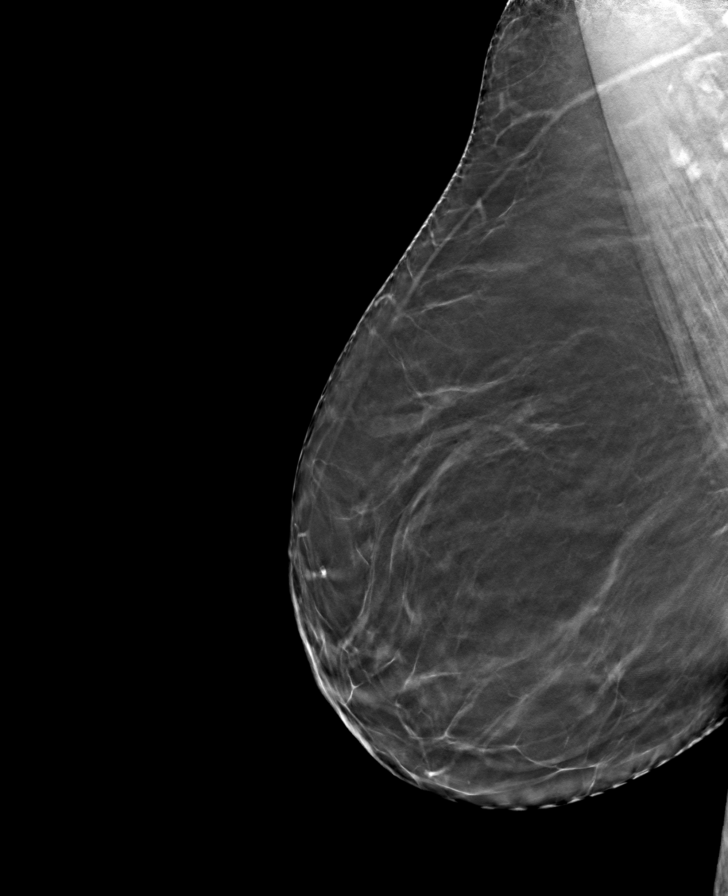

[L MLO tomo · tomo slice 42/83.0]
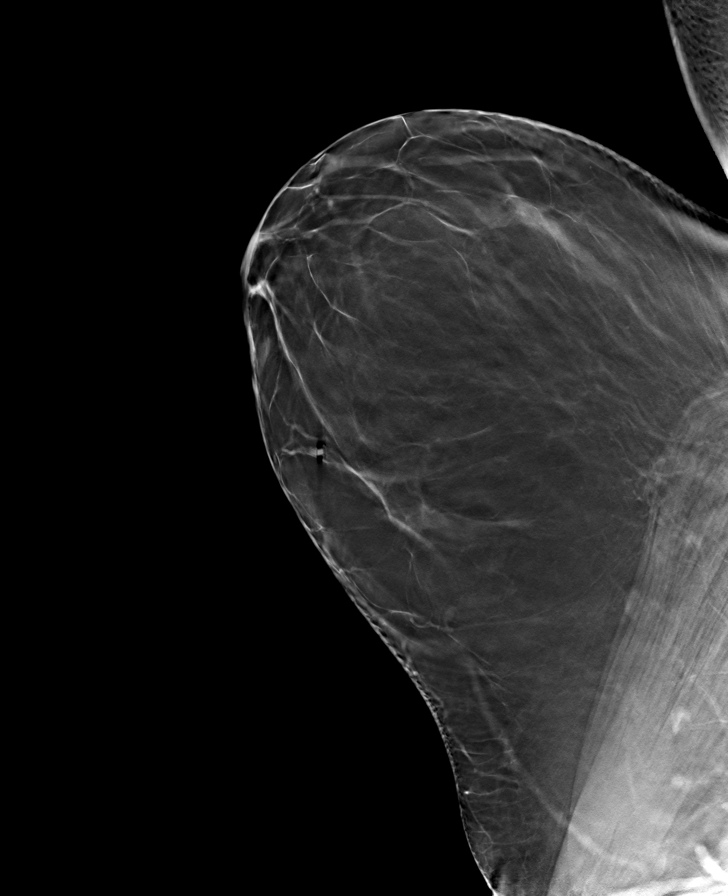

[R CC tomo · tomo slice 37/72.0]
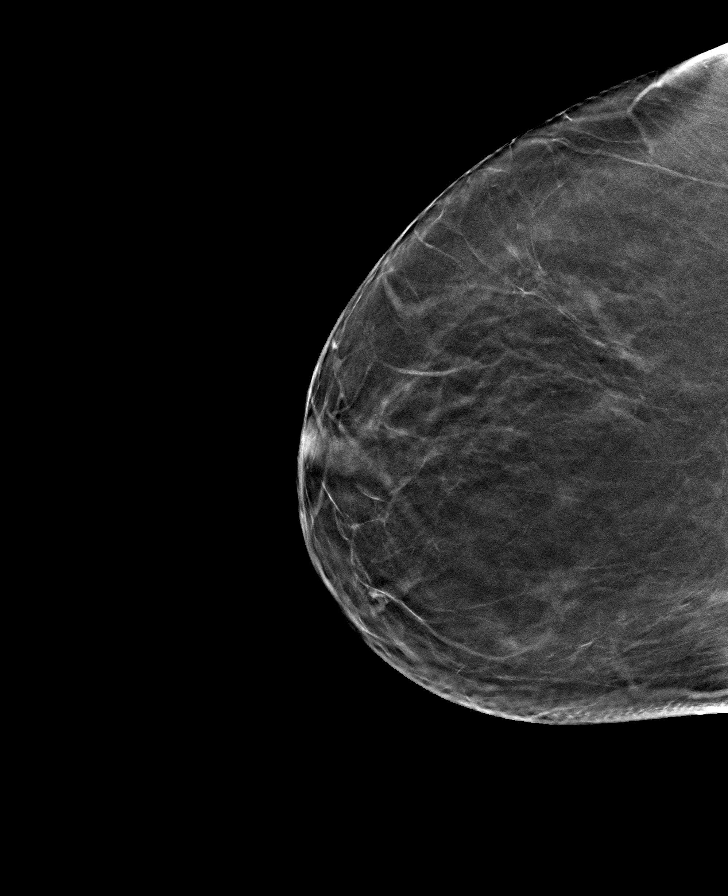

[L CC tomo · tomo slice 35/69.0]
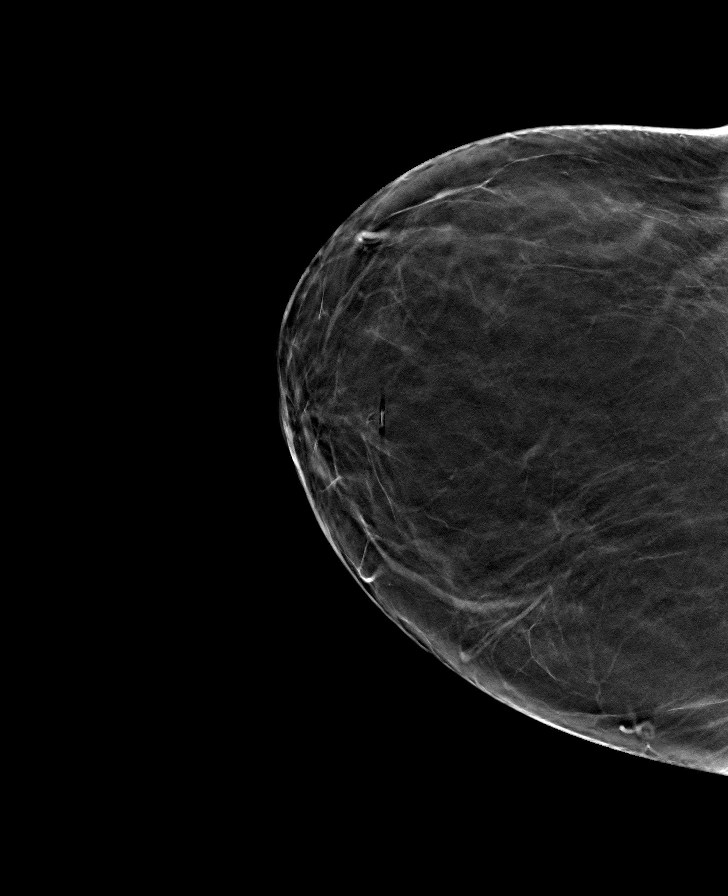

[8 of 24 positions shown; findings below may reference images not displayed]

ACR Breast Density Category b: There are scattered areas of
fibroglandular density.
FINDINGS: There are no findings suspicious for malignancy. Images were
processed with CAD.
IMPRESSION: No mammographic evidence of malignancy. A result letter of this
screening mammogram will be mailed directly to the patient.

RECOMMENDATION:
Screening mammogram in one year. (Code:[id number])

BI-RADS CATEGORY  1: Negative.

## 2021-01-20 ENCOUNTER — Other Ambulatory Visit: Payer: Self-pay | Admitting: Internal Medicine

## 2021-01-20 DIAGNOSIS — B9789 Other viral agents as the cause of diseases classified elsewhere: Secondary | ICD-10-CM

## 2021-03-12 ENCOUNTER — Encounter: Payer: Self-pay | Admitting: Internal Medicine

## 2021-03-14 ENCOUNTER — Telehealth: Payer: Self-pay | Admitting: Internal Medicine

## 2021-03-14 DIAGNOSIS — Z1239 Encounter for other screening for malignant neoplasm of breast: Secondary | ICD-10-CM

## 2021-03-14 NOTE — Telephone Encounter (Signed)
  LAST APPOINTMENT DATE: 01/20/2021   NEXT APPOINTMENT DATE:@Visit  date not found  MEDICATION: fluticasone 50 mcg   PHARMACY: [facility] Patient is completely out of it. EM  Let patient know to contact pharmacy at the end of the day to make sure medication is ready.  Please notify patient to allow 48-72 hours to process  Encourage patient to contact the pharmacy for refills or they can request refills through [place]:   LAST REFILL:  QTY:  REFILL DATE:    OTHER COMMENTS:    Okay for refill?  Please advise

## 2021-03-14 NOTE — Telephone Encounter (Signed)
Patients pcp was [facility]. Patient is needing a mammogram done in July . Patient states that the mammogram place is asking for a provider here. Patient is asking that we provide a name for her so she can get her mammogram done. Please advise what we need to do, Patient is moving and is not following Rollene Fare. Em

## 2021-03-15 NOTE — Telephone Encounter (Signed)
It should be fine to use me as the ordering physician.  Please make sure they route the results to me.  Thanks.

## 2021-03-16 ENCOUNTER — Other Ambulatory Visit: Payer: Self-pay | Admitting: Family

## 2021-03-16 DIAGNOSIS — J329 Chronic sinusitis, unspecified: Secondary | ICD-10-CM

## 2021-03-16 DIAGNOSIS — B9789 Other viral agents as the cause of diseases classified elsewhere: Secondary | ICD-10-CM

## 2021-03-16 MED ORDER — FLUTICASONE PROPIONATE 50 MCG/ACT NA SUSP: NASAL | 1 refills | Status: AC

## 2021-03-16 NOTE — Telephone Encounter (Signed)
Contacted pt and advised that Dr. Damita Dunnings would sign for the order for a mammogram.  Added order for mammogram for [facility] at [facility]

## 2021-03-16 NOTE — Addendum Note (Signed)
Addended by: Randall An on: 03/16/2021 11:02 AM   Modules accepted: Orders

## 2021-05-04 ENCOUNTER — Other Ambulatory Visit: Payer: Self-pay

## 2021-05-04 ENCOUNTER — Ambulatory Visit
Admission: RE | Admit: 2021-05-04 | Discharge: 2021-05-04 | Disposition: A | Discharge status: Home / Self Care | Payer: Medicare HMO | Source: Ambulatory Visit | Attending: Family Medicine | Admitting: Family Medicine

## 2021-05-04 DIAGNOSIS — Z1231 Encounter for screening mammogram for malignant neoplasm of breast: Secondary | ICD-10-CM | POA: Insufficient documentation

## 2021-05-04 DIAGNOSIS — Z1239 Encounter for other screening for malignant neoplasm of breast: Secondary | ICD-10-CM | POA: Diagnosis not present

## 2021-09-27 DIAGNOSIS — E079 Disorder of thyroid, unspecified: Secondary | ICD-10-CM | POA: Diagnosis not present

## 2021-09-27 DIAGNOSIS — J302 Other seasonal allergic rhinitis: Secondary | ICD-10-CM | POA: Diagnosis not present

## 2021-09-27 DIAGNOSIS — S6991XA Unspecified injury of right wrist, hand and finger(s), initial encounter: Secondary | ICD-10-CM | POA: Diagnosis not present

## 2021-09-27 DIAGNOSIS — E782 Mixed hyperlipidemia: Secondary | ICD-10-CM | POA: Diagnosis not present

## 2021-09-27 DIAGNOSIS — Z1329 Encounter for screening for other suspected endocrine disorder: Secondary | ICD-10-CM | POA: Diagnosis not present

## 2021-09-27 DIAGNOSIS — Z13228 Encounter for screening for other metabolic disorders: Secondary | ICD-10-CM | POA: Diagnosis not present

## 2021-09-27 DIAGNOSIS — M7750 Other enthesopathy of unspecified foot: Secondary | ICD-10-CM | POA: Diagnosis not present

## 2021-09-27 DIAGNOSIS — M159 Polyosteoarthritis, unspecified: Secondary | ICD-10-CM | POA: Diagnosis not present

## 2021-09-27 DIAGNOSIS — Z7689 Persons encountering health services in other specified circumstances: Secondary | ICD-10-CM | POA: Diagnosis not present

## 2021-09-27 DIAGNOSIS — M21611 Bunion of right foot: Secondary | ICD-10-CM | POA: Diagnosis not present

## 2021-11-06 DIAGNOSIS — M79674 Pain in right toe(s): Secondary | ICD-10-CM | POA: Diagnosis not present

## 2021-11-06 DIAGNOSIS — M79644 Pain in right finger(s): Secondary | ICD-10-CM | POA: Diagnosis not present

## 2021-11-06 DIAGNOSIS — G8929 Other chronic pain: Secondary | ICD-10-CM | POA: Diagnosis not present

## 2021-11-06 DIAGNOSIS — S62501A Fracture of unspecified phalanx of right thumb, initial encounter for closed fracture: Secondary | ICD-10-CM | POA: Diagnosis not present

## 2021-11-06 DIAGNOSIS — M2021 Hallux rigidus, right foot: Secondary | ICD-10-CM | POA: Diagnosis not present

## 2021-12-15 DIAGNOSIS — E785 Hyperlipidemia, unspecified: Secondary | ICD-10-CM | POA: Diagnosis not present

## 2021-12-15 DIAGNOSIS — Z130 Encounter for screening for diseases of the blood and blood-forming organs and certain disorders involving the immune mechanism: Secondary | ICD-10-CM | POA: Diagnosis not present

## 2021-12-15 DIAGNOSIS — Z13228 Encounter for screening for other metabolic disorders: Secondary | ICD-10-CM | POA: Diagnosis not present

## 2021-12-15 DIAGNOSIS — Z1329 Encounter for screening for other suspected endocrine disorder: Secondary | ICD-10-CM | POA: Diagnosis not present

## 2021-12-15 DIAGNOSIS — E079 Disorder of thyroid, unspecified: Secondary | ICD-10-CM | POA: Diagnosis not present

## 2021-12-20 DIAGNOSIS — Z0000 Encounter for general adult medical examination without abnormal findings: Secondary | ICD-10-CM | POA: Diagnosis not present

## 2021-12-20 DIAGNOSIS — E079 Disorder of thyroid, unspecified: Secondary | ICD-10-CM | POA: Diagnosis not present

## 2021-12-20 DIAGNOSIS — S6991XA Unspecified injury of right wrist, hand and finger(s), initial encounter: Secondary | ICD-10-CM | POA: Diagnosis not present

## 2021-12-20 DIAGNOSIS — E782 Mixed hyperlipidemia: Secondary | ICD-10-CM | POA: Diagnosis not present

## 2022-03-19 DIAGNOSIS — H5203 Hypermetropia, bilateral: Secondary | ICD-10-CM | POA: Diagnosis not present

## 2022-04-02 DIAGNOSIS — D225 Melanocytic nevi of trunk: Secondary | ICD-10-CM | POA: Diagnosis not present

## 2022-04-02 DIAGNOSIS — L578 Other skin changes due to chronic exposure to nonionizing radiation: Secondary | ICD-10-CM | POA: Diagnosis not present

## 2022-04-02 DIAGNOSIS — L728 Other follicular cysts of the skin and subcutaneous tissue: Secondary | ICD-10-CM | POA: Diagnosis not present

## 2022-04-02 DIAGNOSIS — L853 Xerosis cutis: Secondary | ICD-10-CM | POA: Diagnosis not present

## 2022-04-02 DIAGNOSIS — L814 Other melanin hyperpigmentation: Secondary | ICD-10-CM | POA: Diagnosis not present

## 2022-06-11 DIAGNOSIS — Z1231 Encounter for screening mammogram for malignant neoplasm of breast: Secondary | ICD-10-CM | POA: Diagnosis not present

## 2022-06-12 DIAGNOSIS — E782 Mixed hyperlipidemia: Secondary | ICD-10-CM | POA: Diagnosis not present

## 2022-06-15 DIAGNOSIS — Z13228 Encounter for screening for other metabolic disorders: Secondary | ICD-10-CM | POA: Diagnosis not present

## 2022-06-15 DIAGNOSIS — Z6833 Body mass index (BMI) 33.0-33.9, adult: Secondary | ICD-10-CM | POA: Diagnosis not present

## 2022-06-15 DIAGNOSIS — E079 Disorder of thyroid, unspecified: Secondary | ICD-10-CM | POA: Diagnosis not present

## 2022-06-15 DIAGNOSIS — L720 Epidermal cyst: Secondary | ICD-10-CM | POA: Diagnosis not present

## 2022-06-15 DIAGNOSIS — E6609 Other obesity due to excess calories: Secondary | ICD-10-CM | POA: Diagnosis not present

## 2022-06-15 DIAGNOSIS — E782 Mixed hyperlipidemia: Secondary | ICD-10-CM | POA: Diagnosis not present

## 2022-06-15 DIAGNOSIS — Z130 Encounter for screening for diseases of the blood and blood-forming organs and certain disorders involving the immune mechanism: Secondary | ICD-10-CM | POA: Diagnosis not present

## 2022-06-15 DIAGNOSIS — Z2821 Immunization not carried out because of patient refusal: Secondary | ICD-10-CM | POA: Diagnosis not present

## 2022-06-15 DIAGNOSIS — Z1329 Encounter for screening for other suspected endocrine disorder: Secondary | ICD-10-CM | POA: Diagnosis not present

## 2022-07-09 DIAGNOSIS — L538 Other specified erythematous conditions: Secondary | ICD-10-CM | POA: Diagnosis not present

## 2022-07-09 DIAGNOSIS — L720 Epidermal cyst: Secondary | ICD-10-CM | POA: Diagnosis not present

## 2022-07-09 DIAGNOSIS — L728 Other follicular cysts of the skin and subcutaneous tissue: Secondary | ICD-10-CM | POA: Diagnosis not present

## 2022-09-10 DIAGNOSIS — Z780 Asymptomatic menopausal state: Secondary | ICD-10-CM | POA: Diagnosis not present

## 2022-09-10 DIAGNOSIS — Z13820 Encounter for screening for osteoporosis: Secondary | ICD-10-CM | POA: Diagnosis not present

## 2022-09-10 DIAGNOSIS — M85851 Other specified disorders of bone density and structure, right thigh: Secondary | ICD-10-CM | POA: Diagnosis not present

## 2022-12-14 DIAGNOSIS — Z130 Encounter for screening for diseases of the blood and blood-forming organs and certain disorders involving the immune mechanism: Secondary | ICD-10-CM | POA: Diagnosis not present

## 2022-12-14 DIAGNOSIS — E079 Disorder of thyroid, unspecified: Secondary | ICD-10-CM | POA: Diagnosis not present

## 2022-12-14 DIAGNOSIS — E6609 Other obesity due to excess calories: Secondary | ICD-10-CM | POA: Diagnosis not present

## 2022-12-14 DIAGNOSIS — Z6833 Body mass index (BMI) 33.0-33.9, adult: Secondary | ICD-10-CM | POA: Diagnosis not present

## 2022-12-14 DIAGNOSIS — Z1329 Encounter for screening for other suspected endocrine disorder: Secondary | ICD-10-CM | POA: Diagnosis not present

## 2022-12-14 DIAGNOSIS — Z13228 Encounter for screening for other metabolic disorders: Secondary | ICD-10-CM | POA: Diagnosis not present

## 2022-12-14 DIAGNOSIS — E782 Mixed hyperlipidemia: Secondary | ICD-10-CM | POA: Diagnosis not present

## 2022-12-18 DIAGNOSIS — Z8639 Personal history of other endocrine, nutritional and metabolic disease: Secondary | ICD-10-CM | POA: Diagnosis not present

## 2022-12-18 DIAGNOSIS — J302 Other seasonal allergic rhinitis: Secondary | ICD-10-CM | POA: Diagnosis not present

## 2022-12-18 DIAGNOSIS — Z6833 Body mass index (BMI) 33.0-33.9, adult: Secondary | ICD-10-CM | POA: Diagnosis not present

## 2022-12-18 DIAGNOSIS — M79671 Pain in right foot: Secondary | ICD-10-CM | POA: Diagnosis not present

## 2022-12-18 DIAGNOSIS — R141 Gas pain: Secondary | ICD-10-CM | POA: Diagnosis not present

## 2022-12-18 DIAGNOSIS — M25512 Pain in left shoulder: Secondary | ICD-10-CM | POA: Diagnosis not present

## 2022-12-18 DIAGNOSIS — Z2821 Immunization not carried out because of patient refusal: Secondary | ICD-10-CM | POA: Diagnosis not present

## 2022-12-18 DIAGNOSIS — M79672 Pain in left foot: Secondary | ICD-10-CM | POA: Diagnosis not present

## 2022-12-18 DIAGNOSIS — E782 Mixed hyperlipidemia: Secondary | ICD-10-CM | POA: Diagnosis not present

## 2022-12-18 DIAGNOSIS — Z0000 Encounter for general adult medical examination without abnormal findings: Secondary | ICD-10-CM | POA: Diagnosis not present

## 2023-02-19 ENCOUNTER — Telehealth: Payer: Self-pay | Admitting: Family Medicine

## 2023-02-19 NOTE — Telephone Encounter (Signed)
Contacted Julia Moreno to schedule their annual wellness visit. Patient declined to schedule AWV at this time. Moved to Julia Moreno  [facility] Guide [facility] AWV TEAM Direct Dial: [phone]

## 2023-02-19 NOTE — Telephone Encounter (Signed)
Called patient to schedule Medicare Annual Wellness Visit (AWV). Left message for patient to call back and schedule Medicare Annual Wellness Visit (AWV).  Last date of AWV: 11/28/2020  Please schedule an appointment at any time with [facility].  If any questions, please contact me at [phone].  Thank you ,  Randon Goldsmith Care Guide [facility] AWV TEAM Direct Dial: [phone]

## 2023-04-01 DIAGNOSIS — L578 Other skin changes due to chronic exposure to nonionizing radiation: Secondary | ICD-10-CM | POA: Diagnosis not present

## 2023-04-01 DIAGNOSIS — L820 Inflamed seborrheic keratosis: Secondary | ICD-10-CM | POA: Diagnosis not present

## 2023-04-01 DIAGNOSIS — L853 Xerosis cutis: Secondary | ICD-10-CM | POA: Diagnosis not present

## 2023-04-01 DIAGNOSIS — D225 Melanocytic nevi of trunk: Secondary | ICD-10-CM | POA: Diagnosis not present

## 2023-04-01 DIAGNOSIS — L814 Other melanin hyperpigmentation: Secondary | ICD-10-CM | POA: Diagnosis not present

## 2023-04-01 DIAGNOSIS — L538 Other specified erythematous conditions: Secondary | ICD-10-CM | POA: Diagnosis not present

## 2023-05-08 DIAGNOSIS — H5203 Hypermetropia, bilateral: Secondary | ICD-10-CM | POA: Diagnosis not present

## 2023-05-08 DIAGNOSIS — H524 Presbyopia: Secondary | ICD-10-CM | POA: Diagnosis not present

## 2023-06-14 DIAGNOSIS — Z1231 Encounter for screening mammogram for malignant neoplasm of breast: Secondary | ICD-10-CM | POA: Diagnosis not present

## 2023-06-20 DIAGNOSIS — M159 Polyosteoarthritis, unspecified: Secondary | ICD-10-CM | POA: Diagnosis not present

## 2023-06-20 DIAGNOSIS — M21611 Bunion of right foot: Secondary | ICD-10-CM | POA: Diagnosis not present

## 2023-06-20 DIAGNOSIS — M21612 Bunion of left foot: Secondary | ICD-10-CM | POA: Diagnosis not present

## 2023-06-20 DIAGNOSIS — M79644 Pain in right finger(s): Secondary | ICD-10-CM | POA: Diagnosis not present

## 2023-06-20 DIAGNOSIS — Z0000 Encounter for general adult medical examination without abnormal findings: Secondary | ICD-10-CM | POA: Diagnosis not present

## 2023-06-20 DIAGNOSIS — Z23 Encounter for immunization: Secondary | ICD-10-CM | POA: Diagnosis not present

## 2023-06-20 DIAGNOSIS — J302 Other seasonal allergic rhinitis: Secondary | ICD-10-CM | POA: Diagnosis not present

## 2023-06-20 DIAGNOSIS — E079 Disorder of thyroid, unspecified: Secondary | ICD-10-CM | POA: Diagnosis not present

## 2023-06-20 DIAGNOSIS — E782 Mixed hyperlipidemia: Secondary | ICD-10-CM | POA: Diagnosis not present

## 2023-06-20 DIAGNOSIS — Z1330 Encounter for screening examination for mental health and behavioral disorders, unspecified: Secondary | ICD-10-CM | POA: Diagnosis not present

## 2023-06-20 DIAGNOSIS — M79645 Pain in left finger(s): Secondary | ICD-10-CM | POA: Diagnosis not present

## 2023-07-11 DIAGNOSIS — M2021 Hallux rigidus, right foot: Secondary | ICD-10-CM | POA: Diagnosis not present

## 2023-07-11 DIAGNOSIS — M2041 Other hammer toe(s) (acquired), right foot: Secondary | ICD-10-CM | POA: Diagnosis not present

## 2023-07-11 DIAGNOSIS — M21612 Bunion of left foot: Secondary | ICD-10-CM | POA: Diagnosis not present

## 2023-07-11 DIAGNOSIS — M21611 Bunion of right foot: Secondary | ICD-10-CM | POA: Diagnosis not present

## 2023-07-11 DIAGNOSIS — M2042 Other hammer toe(s) (acquired), left foot: Secondary | ICD-10-CM | POA: Diagnosis not present

## 2023-07-11 DIAGNOSIS — M19079 Primary osteoarthritis, unspecified ankle and foot: Secondary | ICD-10-CM | POA: Diagnosis not present

## 2023-07-11 DIAGNOSIS — M2022 Hallux rigidus, left foot: Secondary | ICD-10-CM | POA: Diagnosis not present

## 2023-09-20 DIAGNOSIS — R69 Illness, unspecified: Secondary | ICD-10-CM | POA: Diagnosis not present

## 2023-09-25 DIAGNOSIS — L814 Other melanin hyperpigmentation: Secondary | ICD-10-CM | POA: Diagnosis not present

## 2023-09-25 DIAGNOSIS — D225 Melanocytic nevi of trunk: Secondary | ICD-10-CM | POA: Diagnosis not present

## 2023-09-25 DIAGNOSIS — L853 Xerosis cutis: Secondary | ICD-10-CM | POA: Diagnosis not present

## 2023-09-25 DIAGNOSIS — L578 Other skin changes due to chronic exposure to nonionizing radiation: Secondary | ICD-10-CM | POA: Diagnosis not present

## 2023-12-20 DIAGNOSIS — E782 Mixed hyperlipidemia: Secondary | ICD-10-CM | POA: Diagnosis not present

## 2023-12-20 DIAGNOSIS — E079 Disorder of thyroid, unspecified: Secondary | ICD-10-CM | POA: Diagnosis not present

## 2023-12-24 DIAGNOSIS — Z0000 Encounter for general adult medical examination without abnormal findings: Secondary | ICD-10-CM | POA: Diagnosis not present

## 2023-12-24 DIAGNOSIS — E782 Mixed hyperlipidemia: Secondary | ICD-10-CM | POA: Diagnosis not present

## 2024-06-08 DIAGNOSIS — H5203 Hypermetropia, bilateral: Secondary | ICD-10-CM | POA: Diagnosis not present

## 2024-06-16 DIAGNOSIS — Z1231 Encounter for screening mammogram for malignant neoplasm of breast: Secondary | ICD-10-CM | POA: Diagnosis not present

## 2024-06-16 DIAGNOSIS — R92323 Mammographic fibroglandular density, bilateral breasts: Secondary | ICD-10-CM | POA: Diagnosis not present

## 2024-07-07 DIAGNOSIS — Z0000 Encounter for general adult medical examination without abnormal findings: Secondary | ICD-10-CM | POA: Diagnosis not present

## 2024-07-07 DIAGNOSIS — J302 Other seasonal allergic rhinitis: Secondary | ICD-10-CM | POA: Diagnosis not present

## 2024-07-07 DIAGNOSIS — Z23 Encounter for immunization: Secondary | ICD-10-CM | POA: Diagnosis not present

## 2024-08-03 DIAGNOSIS — D492 Neoplasm of unspecified behavior of bone, soft tissue, and skin: Secondary | ICD-10-CM | POA: Diagnosis not present

## 2024-08-07 DIAGNOSIS — J302 Other seasonal allergic rhinitis: Secondary | ICD-10-CM | POA: Diagnosis not present

## 2024-08-07 DIAGNOSIS — Z23 Encounter for immunization: Secondary | ICD-10-CM | POA: Diagnosis not present

## 2024-08-07 DIAGNOSIS — Z0000 Encounter for general adult medical examination without abnormal findings: Secondary | ICD-10-CM | POA: Diagnosis not present

## 2024-09-08 DIAGNOSIS — Z0000 Encounter for general adult medical examination without abnormal findings: Secondary | ICD-10-CM | POA: Diagnosis not present

## 2024-09-08 DIAGNOSIS — J302 Other seasonal allergic rhinitis: Secondary | ICD-10-CM | POA: Diagnosis not present

## 2024-09-08 DIAGNOSIS — Z23 Encounter for immunization: Secondary | ICD-10-CM | POA: Diagnosis not present

## 2024-09-24 DIAGNOSIS — L814 Other melanin hyperpigmentation: Secondary | ICD-10-CM | POA: Diagnosis not present

## 2024-09-24 DIAGNOSIS — D1801 Hemangioma of skin and subcutaneous tissue: Secondary | ICD-10-CM | POA: Diagnosis not present

## 2024-09-24 DIAGNOSIS — L918 Other hypertrophic disorders of the skin: Secondary | ICD-10-CM | POA: Diagnosis not present

## 2024-09-24 DIAGNOSIS — L578 Other skin changes due to chronic exposure to nonionizing radiation: Secondary | ICD-10-CM | POA: Diagnosis not present

## 2024-09-24 DIAGNOSIS — L853 Xerosis cutis: Secondary | ICD-10-CM | POA: Diagnosis not present

## 2024-09-29 DIAGNOSIS — L821 Other seborrheic keratosis: Secondary | ICD-10-CM | POA: Diagnosis not present

## 2024-09-29 DIAGNOSIS — D492 Neoplasm of unspecified behavior of bone, soft tissue, and skin: Secondary | ICD-10-CM | POA: Diagnosis not present
